# Patient Record
Sex: Female | Born: 1985 | Race: Black or African American | Hispanic: No | Marital: Single | Smoking: Never smoker
Health system: Southern US, Community
[De-identification: ages and names within clinical notes are randomized; demographics above are authoritative.]

## PROBLEM LIST (undated history)

## (undated) ENCOUNTER — Inpatient Hospital Stay (HOSPITAL_COMMUNITY): Payer: Self-pay

## (undated) DIAGNOSIS — N83299 Other ovarian cyst, unspecified side: Secondary | ICD-10-CM

## (undated) DIAGNOSIS — B999 Unspecified infectious disease: Secondary | ICD-10-CM

## (undated) DIAGNOSIS — D219 Benign neoplasm of connective and other soft tissue, unspecified: Secondary | ICD-10-CM

## (undated) DIAGNOSIS — N83209 Unspecified ovarian cyst, unspecified side: Secondary | ICD-10-CM

## (undated) DIAGNOSIS — T8859XA Other complications of anesthesia, initial encounter: Secondary | ICD-10-CM

## (undated) DIAGNOSIS — T4145XA Adverse effect of unspecified anesthetic, initial encounter: Secondary | ICD-10-CM

## (undated) DIAGNOSIS — E049 Nontoxic goiter, unspecified: Secondary | ICD-10-CM

## (undated) HISTORY — PX: KNEE SURGERY: SHX244

## (undated) HISTORY — DX: Other complications of anesthesia, initial encounter: T88.59XA

---
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
## 2008-04-30 ENCOUNTER — Emergency Department (HOSPITAL_COMMUNITY): Admission: EM | Admit: 2008-04-30 | Discharge: 2008-04-30 | Payer: Self-pay | Admitting: Emergency Medicine

## 2009-07-12 ENCOUNTER — Emergency Department (HOSPITAL_COMMUNITY): Admission: EM | Admit: 2009-07-12 | Discharge: 2009-07-12 | Payer: Self-pay | Admitting: Emergency Medicine

## 2009-10-14 ENCOUNTER — Emergency Department (HOSPITAL_COMMUNITY): Admission: EM | Admit: 2009-10-14 | Discharge: 2009-10-14 | Payer: Self-pay | Admitting: Emergency Medicine

## 2009-12-07 ENCOUNTER — Emergency Department (HOSPITAL_COMMUNITY): Admission: EM | Admit: 2009-12-07 | Discharge: 2009-12-08 | Payer: Self-pay | Admitting: Emergency Medicine

## 2011-02-19 LAB — URINALYSIS, ROUTINE W REFLEX MICROSCOPIC
Bilirubin Urine: NEGATIVE
Glucose, UA: NEGATIVE mg/dL
Ketones, ur: NEGATIVE mg/dL
Protein, ur: NEGATIVE mg/dL
Urobilinogen, UA: 1 mg/dL (ref 0.0–1.0)
pH: 5.5 (ref 5.0–8.0)

## 2011-02-19 LAB — CBC
Hemoglobin: 10.8 g/dL — ABNORMAL LOW (ref 12.0–15.0)
MCHC: 33.6 g/dL (ref 30.0–36.0)
MCV: 85.8 fL (ref 78.0–100.0)
Platelets: 232 10*3/uL (ref 150–400)
RBC: 3.73 MIL/uL — ABNORMAL LOW (ref 3.87–5.11)
WBC: 10.8 10*3/uL — ABNORMAL HIGH (ref 4.0–10.5)

## 2011-02-19 LAB — URINE MICROSCOPIC-ADD ON

## 2011-02-19 LAB — GC/CHLAMYDIA PROBE AMP, GENITAL
Chlamydia, DNA Probe: NEGATIVE
GC Probe Amp, Genital: NEGATIVE

## 2011-02-19 LAB — POCT I-STAT, CHEM 8
BUN: 5 mg/dL — ABNORMAL LOW (ref 6–23)
Calcium, Ion: 1.1 mmol/L — ABNORMAL LOW (ref 1.12–1.32)
Chloride: 105 mEq/L (ref 96–112)
Creatinine, Ser: 0.6 mg/dL (ref 0.4–1.2)
Glucose, Bld: 100 mg/dL — ABNORMAL HIGH (ref 70–99)
HCT: 33 % — ABNORMAL LOW (ref 36.0–46.0)
Hemoglobin: 11.2 g/dL — ABNORMAL LOW (ref 12.0–15.0)
Potassium: 3.4 mEq/L — ABNORMAL LOW (ref 3.5–5.1)
Sodium: 139 mEq/L (ref 135–145)
TCO2: 25 mmol/L (ref 0–100)

## 2011-02-19 LAB — WET PREP, GENITAL
WBC, Wet Prep HPF POC: NONE SEEN
Yeast Wet Prep HPF POC: NONE SEEN

## 2011-02-19 LAB — DIFFERENTIAL
Basophils Absolute: 0 10*3/uL (ref 0.0–0.1)
Eosinophils Absolute: 0.1 10*3/uL (ref 0.0–0.7)
Eosinophils Relative: 1 % (ref 0–5)

## 2011-02-19 LAB — POCT PREGNANCY, URINE: Preg Test, Ur: NEGATIVE

## 2011-03-08 ENCOUNTER — Emergency Department (HOSPITAL_COMMUNITY)
Admission: EM | Admit: 2011-03-08 | Discharge: 2011-03-08 | Disposition: A | Discharge status: Home / Self Care | Payer: Self-pay | Attending: Emergency Medicine | Admitting: Emergency Medicine

## 2011-03-08 DIAGNOSIS — H9209 Otalgia, unspecified ear: Secondary | ICD-10-CM | POA: Insufficient documentation

## 2011-03-08 DIAGNOSIS — R51 Headache: Secondary | ICD-10-CM | POA: Insufficient documentation

## 2011-03-08 DIAGNOSIS — R4182 Altered mental status, unspecified: Secondary | ICD-10-CM | POA: Insufficient documentation

## 2011-03-08 DIAGNOSIS — F1010 Alcohol abuse, uncomplicated: Secondary | ICD-10-CM | POA: Insufficient documentation

## 2011-03-08 LAB — COMPREHENSIVE METABOLIC PANEL
Albumin: 4.1 g/dL (ref 3.5–5.2)
Alkaline Phosphatase: 59 U/L (ref 39–117)
Chloride: 116 mEq/L — ABNORMAL HIGH (ref 96–112)
Creatinine, Ser: 0.57 mg/dL (ref 0.4–1.2)
Glucose, Bld: 117 mg/dL — ABNORMAL HIGH (ref 70–99)
Total Bilirubin: 0.5 mg/dL (ref 0.3–1.2)
Total Protein: 7.8 g/dL (ref 6.0–8.3)

## 2011-03-08 LAB — URINE MICROSCOPIC-ADD ON

## 2011-03-08 LAB — RAPID URINE DRUG SCREEN, HOSP PERFORMED
Barbiturates: NOT DETECTED
Benzodiazepines: NOT DETECTED
Cocaine: NOT DETECTED

## 2011-03-08 LAB — URINALYSIS, ROUTINE W REFLEX MICROSCOPIC
Bilirubin Urine: NEGATIVE
Glucose, UA: NEGATIVE mg/dL
Hgb urine dipstick: NEGATIVE
Ketones, ur: NEGATIVE mg/dL
Nitrite: NEGATIVE
Protein, ur: NEGATIVE mg/dL
Specific Gravity, Urine: 1.011 (ref 1.005–1.030)
Urobilinogen, UA: 0.2 mg/dL (ref 0.0–1.0)
Urobilinogen, UA: 2 mg/dL — ABNORMAL HIGH (ref 0.0–1.0)
pH: 6 (ref 5.0–8.0)

## 2011-03-08 LAB — URINE CULTURE: Colony Count: 25000

## 2011-03-08 LAB — ETHANOL: Alcohol, Ethyl (B): 299 mg/dL — ABNORMAL HIGH (ref 0–10)

## 2011-03-08 LAB — SALICYLATE LEVEL: Salicylate Lvl: <4 mg/dL (ref 2.8–20.0)

## 2011-03-11 LAB — COMPREHENSIVE METABOLIC PANEL
AST: 18 U/L (ref 0–37)
Albumin: 4 g/dL (ref 3.5–5.2)
Calcium: 9.3 mg/dL (ref 8.4–10.5)
Chloride: 103 mEq/L (ref 96–112)
Creatinine, Ser: 0.57 mg/dL (ref 0.4–1.2)
GFR calc Af Amer: >60 mL/min (ref >60)
Total Bilirubin: 0.8 mg/dL (ref 0.3–1.2)
Total Protein: 7.6 g/dL (ref 6.0–8.3)

## 2011-03-11 LAB — CBC
MCV: 85.1 fL (ref 78.0–100.0)
Platelets: 294 10*3/uL (ref 150–400)
RDW: 15.3 % (ref 11.5–15.5)
WBC: 10.7 10*3/uL — ABNORMAL HIGH (ref 4.0–10.5)

## 2011-03-11 LAB — URINALYSIS, ROUTINE W REFLEX MICROSCOPIC
Protein, ur: 100 mg/dL — AB
Urobilinogen, UA: 1 mg/dL (ref 0.0–1.0)

## 2011-03-11 LAB — DIFFERENTIAL
Eosinophils Relative: 1 % (ref 0–5)
Lymphocytes Relative: 11 % — ABNORMAL LOW (ref 12–46)
Lymphs Abs: 1.1 10*3/uL (ref 0.7–4.0)
Monocytes Absolute: 1.4 10*3/uL — ABNORMAL HIGH (ref 0.1–1.0)
Monocytes Relative: 13 % — ABNORMAL HIGH (ref 3–12)

## 2011-03-11 LAB — URINE MICROSCOPIC-ADD ON

## 2011-03-11 LAB — URINE CULTURE

## 2011-04-20 ENCOUNTER — Emergency Department (HOSPITAL_COMMUNITY)
Admission: EM | Admit: 2011-04-20 | Discharge: 2011-04-20 | Disposition: A | Discharge status: Other Acute Inpatient Hospital | Payer: Self-pay | Attending: Emergency Medicine | Admitting: Emergency Medicine

## 2011-04-20 ENCOUNTER — Inpatient Hospital Stay (HOSPITAL_COMMUNITY)
Admission: AD | Admit: 2011-04-20 | Discharge: 2011-04-21 | DRG: 882 | Disposition: A | Discharge status: Home / Self Care | Payer: PRIVATE HEALTH INSURANCE | Source: Ambulatory Visit | Attending: Psychiatry | Admitting: Psychiatry

## 2011-04-20 DIAGNOSIS — F1910 Other psychoactive substance abuse, uncomplicated: Secondary | ICD-10-CM

## 2011-04-20 DIAGNOSIS — T394X2A Poisoning by antirheumatics, not elsewhere classified, intentional self-harm, initial encounter: Secondary | ICD-10-CM | POA: Insufficient documentation

## 2011-04-20 DIAGNOSIS — F329 Major depressive disorder, single episode, unspecified: Secondary | ICD-10-CM

## 2011-04-20 DIAGNOSIS — T40601A Poisoning by unspecified narcotics, accidental (unintentional), initial encounter: Secondary | ICD-10-CM | POA: Insufficient documentation

## 2011-04-20 DIAGNOSIS — T50992A Poisoning by other drugs, medicaments and biological substances, intentional self-harm, initial encounter: Secondary | ICD-10-CM | POA: Insufficient documentation

## 2011-04-20 DIAGNOSIS — R45851 Suicidal ideations: Secondary | ICD-10-CM

## 2011-04-20 DIAGNOSIS — F4320 Adjustment disorder, unspecified: Principal | ICD-10-CM

## 2011-04-20 DIAGNOSIS — T398X2A Poisoning by other nonopioid analgesics and antipyretics, not elsewhere classified, intentional self-harm, initial encounter: Secondary | ICD-10-CM | POA: Insufficient documentation

## 2011-04-20 DIAGNOSIS — F3289 Other specified depressive episodes: Secondary | ICD-10-CM | POA: Insufficient documentation

## 2011-04-20 DIAGNOSIS — T450X4A Poisoning by antiallergic and antiemetic drugs, undetermined, initial encounter: Secondary | ICD-10-CM | POA: Insufficient documentation

## 2011-04-20 DIAGNOSIS — T39314A Poisoning by propionic acid derivatives, undetermined, initial encounter: Secondary | ICD-10-CM | POA: Insufficient documentation

## 2011-04-20 LAB — CBC
HCT: 34.6 % — ABNORMAL LOW (ref 36.0–46.0)
Hemoglobin: 11 g/dL — ABNORMAL LOW (ref 12.0–15.0)
MCHC: 31.8 g/dL (ref 30.0–36.0)
MCV: 80.7 fL (ref 78.0–100.0)
RDW: 15.3 % (ref 11.5–15.5)

## 2011-04-20 LAB — RAPID URINE DRUG SCREEN, HOSP PERFORMED
Barbiturates: NOT DETECTED
Opiates: POSITIVE — AB

## 2011-04-20 LAB — URINALYSIS, ROUTINE W REFLEX MICROSCOPIC
Bilirubin Urine: NEGATIVE
Nitrite: NEGATIVE
Specific Gravity, Urine: 1.027 (ref 1.005–1.030)
Urobilinogen, UA: 0.2 mg/dL (ref 0.0–1.0)
pH: 6 (ref 5.0–8.0)

## 2011-04-20 LAB — COMPREHENSIVE METABOLIC PANEL
AST: 21 U/L (ref 0–37)
BUN: 6 mg/dL (ref 6–23)
CO2: 22 mEq/L (ref 19–32)
Calcium: 8 mg/dL — ABNORMAL LOW (ref 8.4–10.5)
Chloride: 105 mEq/L (ref 96–112)
Creatinine, Ser: 0.52 mg/dL (ref 0.4–1.2)
GFR calc Af Amer: >60 mL/min (ref >60)
GFR calc non Af Amer: >60 mL/min (ref >60)
Total Bilirubin: 0.2 mg/dL — ABNORMAL LOW (ref 0.3–1.2)

## 2011-04-20 LAB — DIFFERENTIAL
Eosinophils Relative: 1 % (ref 0–5)
Lymphocytes Relative: 19 % (ref 12–46)
Lymphs Abs: 1.3 10*3/uL (ref 0.7–4.0)
Monocytes Absolute: 1.1 10*3/uL — ABNORMAL HIGH (ref 0.1–1.0)
Neutro Abs: 4.3 10*3/uL (ref 1.7–7.7)

## 2011-04-20 LAB — URINE MICROSCOPIC-ADD ON

## 2011-04-20 LAB — ETHANOL: Alcohol, Ethyl (B): 141 mg/dL — ABNORMAL HIGH (ref 0–10)

## 2011-04-20 LAB — SALICYLATE LEVEL: Salicylate Lvl: <2 mg/dL — ABNORMAL LOW (ref 2.8–20.0)

## 2011-04-20 LAB — POCT PREGNANCY, URINE: Preg Test, Ur: NEGATIVE

## 2011-04-21 DIAGNOSIS — F329 Major depressive disorder, single episode, unspecified: Secondary | ICD-10-CM

## 2011-06-05 NOTE — H&P (Signed)
  NAME:  CORNELL, GABER NO.:  [id number]  MEDICAL RECORD NO.:  [id number]  LOCATION:                                FACILITY:  [facility]  PHYSICIAN:  Landry Corporal, N.P.    DATE OF BIRTH:  05/16/1982  DATE OF ADMISSION:  04/20/2011 DATE OF DISCHARGE:                      PSYCHIATRIC ADMISSION ASSESSMENT   HISTORY OF PRESENT ILLNESS:This was done by reviewing the record. The patient was admitted after overdosing while intoxicated on alcohol.  Her urine drug screen was positive for opiates.  The patient was having some depression and having some stress at home.  MEDICATIONS:  The patient was on no medications.  ALLERGIES:  She had no allergies.  IMPRESSION AND PLAN:  Plan was to contact family.  The patient was insisting on being discharged.  She was providing vague responses.  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     Landry Corporal, N.P.     JO/MEDQ  D:  06/01/2011  T:  06/02/2011  Job:  [id number]  Electronically Signed by Limmie PatriciaP. on 06/02/2011 02:31:52 PM Electronically Signed by Nelly Rout MD on 06/05/2011 09:37:12 AM

## 2011-08-30 LAB — WET PREP, GENITAL: Yeast Wet Prep HPF POC: NONE SEEN

## 2011-08-30 LAB — CBC
HCT: 35.2 — ABNORMAL LOW
Hemoglobin: 11.9 — ABNORMAL LOW
MCHC: 33.7
MCV: 86
RBC: 4.09
WBC: 8.2

## 2011-08-30 LAB — URINALYSIS, ROUTINE W REFLEX MICROSCOPIC
Bilirubin Urine: NEGATIVE
Glucose, UA: NEGATIVE
Hgb urine dipstick: NEGATIVE
Ketones, ur: NEGATIVE
Protein, ur: NEGATIVE
pH: 7.5

## 2011-08-30 LAB — POCT I-STAT, CHEM 8
BUN: 6
Calcium, Ion: 1.19
Chloride: 104
Creatinine, Ser: 0.7
Glucose, Bld: 109 — ABNORMAL HIGH
HCT: 38
Hemoglobin: 12.9
Potassium: 3.2 — ABNORMAL LOW
Sodium: 141
TCO2: 27

## 2011-08-30 LAB — GC/CHLAMYDIA PROBE AMP, GENITAL
Chlamydia, DNA Probe: NEGATIVE
GC Probe Amp, Genital: NEGATIVE

## 2011-08-30 LAB — DIFFERENTIAL
Basophils Relative: 0
Eosinophils Absolute: 0.1
Lymphs Abs: 1.3
Monocytes Absolute: 0.8
Monocytes Relative: 10
Neutrophils Relative %: 73

## 2011-08-30 LAB — POCT PREGNANCY, URINE: Preg Test, Ur: NEGATIVE

## 2011-08-30 LAB — RPR: RPR Ser Ql: NONREACTIVE

## 2012-02-20 ENCOUNTER — Encounter (HOSPITAL_COMMUNITY): Payer: Self-pay | Admitting: *Deleted

## 2012-02-20 ENCOUNTER — Inpatient Hospital Stay (HOSPITAL_COMMUNITY)
Admission: AD | Admit: 2012-02-20 | Discharge: 2012-02-20 | Disposition: A | Discharge status: Home / Self Care | Payer: Self-pay | Source: Ambulatory Visit | Attending: Obstetrics and Gynecology | Admitting: Obstetrics and Gynecology

## 2012-02-20 DIAGNOSIS — N946 Dysmenorrhea, unspecified: Secondary | ICD-10-CM | POA: Insufficient documentation

## 2012-02-20 HISTORY — DX: Nontoxic goiter, unspecified: E04.9

## 2012-02-20 HISTORY — DX: Unspecified ovarian cyst, unspecified side: N83.209

## 2012-02-20 LAB — URINALYSIS, ROUTINE W REFLEX MICROSCOPIC
Bilirubin Urine: NEGATIVE
Glucose, UA: NEGATIVE mg/dL
Ketones, ur: NEGATIVE mg/dL
Leukocytes, UA: NEGATIVE
Nitrite: NEGATIVE
Protein, ur: NEGATIVE mg/dL
Specific Gravity, Urine: >1.03 — ABNORMAL HIGH (ref 1.005–1.030)
Urobilinogen, UA: 0.2 mg/dL (ref 0.0–1.0)
pH: 6 (ref 5.0–8.0)

## 2012-02-20 LAB — HCG, SERUM, QUALITATIVE: Preg, Serum: NEGATIVE

## 2012-02-20 LAB — URINE MICROSCOPIC-ADD ON

## 2012-02-20 NOTE — MAU Note (Addendum)
Pt reports cramping yesterday. Having some spotting. Missed period . LMP 01/19/12. Had positive pregnancy test at home. Pt stated she was told several years ago she could not get pregnant because she has cyst on her overies and her thyriod was "messed up".

## 2012-02-20 NOTE — MAU Provider Note (Signed)
Chief Complaint:  Possible Pregnancy    First Provider Initiated Contact with Patient 02/20/12 1129      Ana Moore is  26 y.o. G0P0.  Patient's last menstrual period was 01/19/2012..    She presents complaining of Possible Pregnancy  Presents confirmation of pregnancy. Reports menstrual-like cramping yesterday, none today. Vaginal spotting. States +UPT at home yesterday. Denies abd pain, discharge, HA, visual disturbance, N/V/D, fever, or chills today.   Obstetrical/Gynecological History: OB History    Grav Para Term Preterm Abortions TAB SAB Ect Mult Living   0         0      Past Medical History: Past Medical History  Diagnosis Date  . Ovarian cyst   . Thyroid enlarged     Past Surgical History: Past Surgical History  Procedure Date  . No past surgeries     Family History: No family history on file.  Social History: History  Substance Use Topics  . Smoking status: Never Smoker   . Smokeless tobacco: Not on file  . Alcohol Use: 1.2 oz/week    2 Cans of beer per week    Allergies: No Known Allergies  No prescriptions prior to admission    Review of Systems - Negative except what has been reviewed in HPI  Physical Exam   Blood pressure 109/76, pulse 73, temperature 97.7 F (36.5 C), temperature source Oral, resp. rate 16, height 5\' 4"  (1.626 m), weight 63.504 kg (140 lb), last menstrual period 01/19/2012.  General: General appearance - alert, well appearing, and in no distress, oriented to person, place, and time and normal appearing weight Mental status - alert, oriented to person, place, and time, normal mood, behavior, speech, dress, motor activity, and thought processes, affect appropriate to mood Abdomen - soft, nontender, nondistended, no masses or organomegaly Focused Gynecological Exam: examination not indicated  Labs: Recent Results (from the past 24 hour(s))  URINALYSIS, ROUTINE W REFLEX MICROSCOPIC   Collection Time   02/20/12 11:05 AM        Component Value Range   Color, Urine YELLOW  YELLOW    APPearance CLEAR  CLEAR    Specific Gravity, Urine >1.030 (*) 1.005 - 1.030    pH 6.0  5.0 - 8.0    Glucose, UA NEGATIVE  NEGATIVE (mg/dL)   Hgb urine dipstick MODERATE (*) NEGATIVE    Bilirubin Urine NEGATIVE  NEGATIVE    Ketones, ur NEGATIVE  NEGATIVE (mg/dL)   Protein, ur NEGATIVE  NEGATIVE (mg/dL)   Urobilinogen, UA 0.2  0.0 - 1.0 (mg/dL)   Nitrite NEGATIVE  NEGATIVE    Leukocytes, UA NEGATIVE  NEGATIVE   URINE MICROSCOPIC-ADD ON   Collection Time   02/20/12 11:05 AM      Component Value Range   Squamous Epithelial / LPF FEW (*) RARE    WBC, UA 0-2  <3 (WBC/hpf)   RBC / HPF 0-2  <3 (RBC/hpf)   Bacteria, UA FEW (*) RARE    Urine-Other MUCOUS PRESENT    POCT PREGNANCY, URINE   Collection Time   02/20/12 11:20 AM      Component Value Range   Preg Test, Ur NEGATIVE  NEGATIVE   HCG, SERUM, QUALITATIVE   Collection Time   02/20/12 11:38 AM      Component Value Range   Preg, Serum NEGATIVE  NEGATIVE      Assessment: Dysmenorrhea  Plan: Discharge home NSAIDs prn cramping FU with Gyn provider of your choice for routine care  Referral list given  Tametria Aho E. 02/20/2012,1:16 PM

## 2012-02-20 NOTE — Discharge Instructions (Signed)
Dysmenorrhea Menstrual pain is caused by the muscles of the uterus tightening (contracting) during a menstrual period. The muscles of the uterus contract due to the chemicals in the uterine lining. Primary dysmenorrhea is menstrual cramps that last a couple of days when you start having menstrual periods or soon after. This often begins after a teenager starts having her period. As a woman gets older or has a baby, the cramps will usually lesson or disappear. Secondary dysmenorrhea begins later in life, lasts longer, and the pain may be stronger than primary dysmenorrhea. The pain may start before the period and last a few days after the period. This type of dysmenorrhea is usually caused by an underlying problem such as:  The tissue lining the uterus grows outside of the uterus in other areas of the body (endometriosis).   The endometrial tissue, which normally lines the uterus, is found in or grows into the muscular walls of the uterus (adenomyosis).   The pelvic blood vessels are engorged with blood just before the menstrual period (pelvic congestive syndrome).   Overgrowth of cells in the lining of the uterus or cervix (polyps of the uterus or cervix).   Falling down of the uterus (prolapse) because of loose or stretched ligaments.   Depression.   Bladder problems, infection, or inflammation.   Problems with the intestine, a tumor, or irritable bowel syndrome.   Cancer of the female organs or bladder.   A severely tipped uterus.   A very tight opening or closed cervix.   Noncancerous tumors of the uterus (fibroids).   Pelvic inflammatory disease (PID).   Pelvic scarring (adhesions) from a previous surgery.   Ovarian cyst.   An intrauterine device (IUD) used for birth control.  CAUSES  The cause of menstrual pain is often unknown. SYMPTOMS   Cramping or throbbing pain in your lower abdomen.   Sometimes, a woman may also experience headaches.   Lower back pain.    Feeling sick to your stomach (nausea) or vomiting.   Diarrhea.   Sweating or dizziness.  DIAGNOSIS  A diagnosis is based on your history, symptoms, physical examination, diagnostic tests, or procedures. Diagnostic tests or procedures may include:  Blood tests.   An ultrasound.   An examination of the lining of the uterus (dilation and curettage, D&C).   An examination inside your abdomen or pelvis with a scope (laparoscopy).   X-rays.   CT Scan.   MRI.   An examination inside the bladder with a scope (cystoscopy).   An examination inside the intestine or stomach with a scope (colonoscopy, gastroscopy).  TREATMENT  Treatment depends on the cause of the dysmenorrhea. Treatment may include:  Pain medicine prescribed by your caregiver.   Birth control pills.   Hormone replacement therapy.   Nonsteroidal anti-inflammatory drugs (NSAIDs). These may help stop the production of prostaglandins.   An IUD with progesterone hormone in it.   Acupuncture.   Surgery to remove adhesions, endometriosis, ovarian cyst, or fibroids.   Removal of the uterus (hysterectomy).   Progesterone shots to stop the menstrual period.   Cutting the nerves on the sacrum that go to the female organs (presacral neurectomy).   Electric currant to the sacral nerves (sacral nerve stimulation).   Antidepressant medicine.   Psychiatric therapy, counseling, or group therapy.   Exercise and physical therapy.   Meditation and yoga therapy.  HOME CARE INSTRUCTIONS   Only take over-the-counter or prescription medicines for pain, discomfort, or fever as directed by your   caregiver.   Place a heating pad or hot water bottle on your lower back or abdomen. Do not sleep with the heating pad.   Use aerobic exercises, walking, swimming, biking, and other exercises to help lessen the cramping.   Massage to the lower back or abdomen may help.   Stop smoking.   Avoid alcohol and caffeine.   Yoga,  meditation, or acupuncture may help.  SEEK MEDICAL CARE IF:   The pain does not get better with medicine.   You have pain with sexual intercourse.  SEEK IMMEDIATE MEDICAL CARE IF:   Your pain increases and is not controlled with medicines.   You have a fever.   You develop nausea or vomiting with your period not controlled with medicine.   You have abnormal vaginal bleeding with your period.   You pass out.  MAKE SURE YOU:   Understand these instructions.   Will watch your condition.   Will get help right away if you are not doing well or get worse.  Document Released: 11/20/2005 Document Revised: 11/09/2011 Document Reviewed: 03/08/2009 [facility] Patient Information 2012 [place], [place]. RESOURCE GUIDE  Dental Problems  Patients with Medicaid: [facility]                     [phone] [address]                                           Phone:  [phone]                                                  If unable to pay or uninsured, contact:  [facility] or [facility]. to become qualified for the adult dental clinic.  Chronic Pain Problems Contact [facility]  [phone] Patients need to be referred by their primary care doctor.  Insufficient Money for Medicine Contact [employer]:  call "211" or [facility] 864-153-4880.  No Primary Care Doctor Call [facility]  [phone] Other agencies that provide inexpensive medical care    [facility]  [phone]    [facility]  [phone]    [facility]  [phone]    [facility]  [phone]    [facility]  [phone]    [facility]  [phone]  Substance Abuse Resources [facility] Services  [phone] [facility] [phone] [facility] [place] [phone] Floydene Flock [phone] Residential & Outpatient Substance Abuse Program  [phone]  Psychological Services [facility]  [phone] [facility]  [phone] [facility]   [phone] (emergency services [phone])  Abuse/Neglect [facility] Child Abuse Hotline 316-656-3810 [facility] Child Abuse Hotline 331-289-6105 (After Hours)  Emergency Shelter [facility] 4187615071  [facility] 970-747-3010 [facility] ([phone]  MRSA Hotline #:   (903)196-9691    [facility] Resources  [facility]  [employer]                           [facility] Dept. [address] [place]  [address]         [address]  Blondell Reveal Phone:  [phone]                                  Phone:  [phone]                   Phone:  [phone]  [facility] Phone:  [phone]  [facility] Child Abuse Hotline [phone] [phone] (After Hours)

## 2012-02-22 NOTE — MAU Provider Note (Signed)
Agree with above note.  Ana Moore 02/22/2012 10:00 AM

## 2012-10-03 ENCOUNTER — Emergency Department (HOSPITAL_COMMUNITY): Payer: Self-pay

## 2012-10-03 ENCOUNTER — Emergency Department (HOSPITAL_COMMUNITY)
Admission: EM | Admit: 2012-10-03 | Discharge: 2012-10-03 | Disposition: A | Discharge status: Home / Self Care | Payer: Self-pay | Attending: Emergency Medicine | Admitting: Emergency Medicine

## 2012-10-03 DIAGNOSIS — E049 Nontoxic goiter, unspecified: Secondary | ICD-10-CM | POA: Insufficient documentation

## 2012-10-03 DIAGNOSIS — M79673 Pain in unspecified foot: Secondary | ICD-10-CM

## 2012-10-03 DIAGNOSIS — M79609 Pain in unspecified limb: Secondary | ICD-10-CM | POA: Insufficient documentation

## 2012-10-03 DIAGNOSIS — N83209 Unspecified ovarian cyst, unspecified side: Secondary | ICD-10-CM | POA: Insufficient documentation

## 2012-10-03 MED ORDER — OXYCODONE-ACETAMINOPHEN 5-325 MG PO TABS
1.0000 | ORAL_TABLET | Freq: Four times a day (QID) | ORAL | Status: DC | PRN
Start: 2012-10-03 — End: 2013-05-08

## 2012-10-03 MED ORDER — IBUPROFEN 600 MG PO TABS: 600.0000 mg | ORAL_TABLET | Freq: Four times a day (QID) | ORAL | Status: DC | PRN

## 2012-10-03 NOTE — ED Provider Notes (Signed)
History  This chart was scribed for [employer]. Rubin Payor, MD by Sofie Rower. The patient was seen in room TR07C/TR07C and the patient's care was started at 11:02PM.       CSN: [id number]  Arrival date & time 10/03/12  1049   First MD Initiated Contact with Patient 10/03/12 1102      Chief Complaint  Patient presents with  . Foot Pain    (Consider location/radiation/quality/duration/timing/severity/associated sxs/prior treatment) Patient is a 26 y.o. female presenting with lower extremity pain. The history is provided by the patient. No language interpreter was used.  Foot Pain    Ana Moore is a 26 y.o. female who presents to the Emergency Department complaining of sudden, progressively worsening, foot pain located at the left foot, radiating upwards towards the left ankle, onset five days ago (09/28/12). Modifying factors include certain movements and positions of the left foot which intensifies the foot pain. The pt has a hx of ovarian cyst and enlarged thyroid.   The pt denies wearing any new shoes recently, previous injuries to the left foot, and joint problems in the past.  The pt does not smoke, however, she does drink alcohol.      Past Medical History  Diagnosis Date  . Ovarian cyst   . Thyroid enlarged     Past Surgical History  Procedure Date  . No past surgeries     No family history on file.  History  Substance Use Topics  . Smoking status: Never Smoker   . Smokeless tobacco: Not on file  . Alcohol Use: 1.2 oz/week    2 Cans of beer per week    OB History    Grav Para Term Preterm Abortions TAB SAB Ect Mult Living   0         0      Review of Systems  All other systems reviewed and are negative.    Allergies  Review of patient's allergies indicates no known allergies.  Home Medications   Current Outpatient Rx  Name Route Sig Dispense Refill  . IBUPROFEN 200 MG PO TABS Oral Take 400 mg by mouth every 6 (six) hours as needed. For  pain.    . IBUPROFEN 600 MG PO TABS Oral Take 1 tablet (600 mg total) by mouth every 6 (six) hours as needed for pain. 15 tablet 0  . OXYCODONE-ACETAMINOPHEN 5-325 MG PO TABS Oral Take 1-2 tablets by mouth every 6 (six) hours as needed for pain. 8 tablet 0    BP 112/78  Pulse 72  Temp 98.3 F (36.8 C) (Oral)  Resp 16  SpO2 100%  LMP 10/01/2012  Physical Exam  Nursing note and vitals reviewed. Constitutional: She is oriented to person, place, and time. She appears well-developed and well-nourished.  HENT:  Head: Atraumatic.  Nose: Nose normal.  Eyes: Conjunctivae normal and EOM are normal.  Neck: Normal range of motion.  Cardiovascular: Normal rate.   Pulmonary/Chest: Effort normal.  Musculoskeletal: Normal range of motion.       Left foot: She exhibits tenderness. She exhibits no crepitus and no deformity.       Left foot :Tenderness medially over the mid foot. No crepitus, no deformity. No tenderness over the plantar fascia. No erythema.   Neurological: She is alert and oriented to person, place, and time.  Skin: Skin is warm and dry.  Psychiatric: She has a normal mood and affect. Her behavior is normal.    ED Course  Procedures (including  critical care time)    COORDINATION OF CARE:  11:15 AM- Treatment plan concerning x-ray of left foot and anti-inflammatories discussed with patient. Pt agrees with treatment.   12:08 PM- Recheck. Treatment plan concerning x-ray results and application of anti-inflammatories discussed with patient. Pt agrees with treatment.        Labs Reviewed - No data to display Dg Foot Complete Left  10/03/2012  *RADIOLOGY REPORT*  Clinical Data: Mid foot pain at first metatarsal  LEFT FOOT - COMPLETE 3+ VIEW  Comparison: None  Findings: No fracture or dislocation.  IMPRESSION: Negative   Original Report Authenticated By: Esperanza Heir, M.D.       1. Foot pain       MDM  Patient with pain in her left foot. Negative x-ray. No clear  trauma or overuse. Patient be discharged home with anti-inflammatories, pain medicines and also followup as needed. She'll also be given a postop shoe     I personally performed the services described in this documentation, which was scribed in my presence. The recorded information has been reviewed and considered.     Juliet Rude. Rubin Payor, MD 10/03/12 1216

## 2012-10-03 NOTE — ED Notes (Signed)
Pt co inner foot pain without injury aince Sat night

## 2012-10-03 NOTE — ED Notes (Signed)
Patient transported to X-ray 

## 2012-10-03 NOTE — Progress Notes (Signed)
Orthopedic Tech Progress Note Patient Details:  Ana Moore [birth year] [id number] Post op shoe applied to Left foot. Tolerated well.  Ortho Devices Type of Ortho Device: Postop boot Ortho Device/Splint Location: Left  Ortho Device/Splint Interventions: Application   Asia R Thompson 10/03/2012, 12:37 PM

## 2012-10-03 NOTE — ED Notes (Signed)
Ortho paged. 

## 2013-05-08 ENCOUNTER — Emergency Department (HOSPITAL_COMMUNITY): Payer: Self-pay

## 2013-05-08 ENCOUNTER — Emergency Department (HOSPITAL_COMMUNITY)
Admission: EM | Admit: 2013-05-08 | Discharge: 2013-05-08 | Disposition: A | Discharge status: Home / Self Care | Payer: Self-pay | Attending: Emergency Medicine | Admitting: Emergency Medicine

## 2013-05-08 ENCOUNTER — Encounter (HOSPITAL_COMMUNITY): Payer: Self-pay | Admitting: Emergency Medicine

## 2013-05-08 DIAGNOSIS — N83209 Unspecified ovarian cyst, unspecified side: Secondary | ICD-10-CM | POA: Insufficient documentation

## 2013-05-08 DIAGNOSIS — Z862 Personal history of diseases of the blood and blood-forming organs and certain disorders involving the immune mechanism: Secondary | ICD-10-CM | POA: Insufficient documentation

## 2013-05-08 DIAGNOSIS — Z3202 Encounter for pregnancy test, result negative: Secondary | ICD-10-CM | POA: Insufficient documentation

## 2013-05-08 DIAGNOSIS — Z8639 Personal history of other endocrine, nutritional and metabolic disease: Secondary | ICD-10-CM | POA: Insufficient documentation

## 2013-05-08 DIAGNOSIS — R1110 Vomiting, unspecified: Secondary | ICD-10-CM | POA: Insufficient documentation

## 2013-05-08 LAB — URINALYSIS, ROUTINE W REFLEX MICROSCOPIC
Bilirubin Urine: NEGATIVE
Ketones, ur: NEGATIVE mg/dL
Nitrite: NEGATIVE
Specific Gravity, Urine: 1.018 (ref 1.005–1.030)
pH: 7 (ref 5.0–8.0)

## 2013-05-08 LAB — URINE MICROSCOPIC-ADD ON

## 2013-05-08 LAB — WET PREP, GENITAL
Trich, Wet Prep: NONE SEEN
Yeast Wet Prep HPF POC: NONE SEEN

## 2013-05-08 LAB — GC/CHLAMYDIA PROBE AMP
CT Probe RNA: NEGATIVE
GC Probe RNA: NEGATIVE

## 2013-05-08 MED ORDER — HYDROCODONE-ACETAMINOPHEN 5-325 MG PO TABS: 2.0000 | ORAL_TABLET | Freq: Four times a day (QID) | ORAL | Status: DC | PRN

## 2013-05-08 MED ORDER — OXYCODONE-ACETAMINOPHEN 5-325 MG PO TABS
2.0000 | ORAL_TABLET | Freq: Once | ORAL | Status: AC
  Administered 2013-05-08: 2 via ORAL
  Filled 2013-05-08: qty 2

## 2013-05-08 MED ORDER — KETOROLAC TROMETHAMINE 60 MG/2ML IM SOLN
60.0000 mg | Freq: Once | INTRAMUSCULAR | Status: DC
  Filled 2013-05-08: qty 2

## 2013-05-08 NOTE — ED Notes (Signed)
Pt brought to ED via [facility] for evaluation of abdominal pain.  After engaging in sexual intercourse this evening pt began having severe mid abdominal pain- hx of ovarian cysts.  Pt LMP was 3 days ago.

## 2013-05-08 NOTE — ED Provider Notes (Signed)
History     CSN: [id number]  Arrival date & time 05/08/13  0107   First MD Initiated Contact with Patient 05/08/13 0123      Chief Complaint  Patient presents with  . Abdominal Pain    (Consider location/radiation/quality/duration/timing/severity/associated sxs/prior treatment) The history is provided by the patient.  Ana Moore is a 27 y.o. female hx of ovarian cyst, possible fibroids here with lower abdominal pain. She was having vaginal intercourse with her female partner at 10:30 PM last night. Afterwards she had acute onset of lower abdominal pain. It is across her entire lower abdomen. Denies vaginal discharge or urinary symptoms. She had some vomiting as well. She had remote history of ovarian cysts and thought she may have fibroids but was never formally diagnosed. LMP 3 days ago.    Past Medical History  Diagnosis Date  . Ovarian cyst   . Thyroid enlarged     Past Surgical History  Procedure Laterality Date  . No past surgeries      No family history on file.  History  Substance Use Topics  . Smoking status: Never Smoker   . Smokeless tobacco: Not on file  . Alcohol Use: 1.2 oz/week    2 Cans of beer per week    OB History   Grav Para Term Preterm Abortions TAB SAB Ect Mult Living   0         0      Review of Systems  Gastrointestinal: Positive for vomiting and abdominal pain.  All other systems reviewed and are negative.    Allergies  Shellfish allergy  Home Medications   Current Outpatient Rx  Name  Route  Sig  Dispense  Refill  . ibuprofen (ADVIL,MOTRIN) 200 MG tablet   Oral   Take 200-1,000 mg by mouth every 6 (six) hours as needed for pain. For pain.           BP 105/69  Pulse 75  Temp(Src) 97.9 F (36.6 C) (Oral)  SpO2 99%  LMP 05/05/2013  Physical Exam  Nursing note and vitals reviewed. Constitutional: She is oriented to person, place, and time.  Uncomfortable, holding abdomen   HENT:  Head: Normocephalic.   Mouth/Throat: Oropharynx is clear and moist.  Eyes: Conjunctivae are normal. Pupils are equal, round, and reactive to light.  Neck: Normal range of motion. Neck supple.  Cardiovascular: Normal rate, regular rhythm and normal heart sounds.   Pulmonary/Chest: Effort normal and breath sounds normal. No respiratory distress. She has no wheezes. She has no rales.  Abdominal: Soft.  + firm and tender lower abdominal mass.   Genitourinary:  No obvious labial tears. No CMT. No adnexal tenderness. + enlarged uterus that is firm and tender.   Musculoskeletal: Normal range of motion.  Neurological: She is alert and oriented to person, place, and time.  Skin: Skin is warm and dry.  Psychiatric: She has a normal mood and affect. Her behavior is normal. Judgment and thought content normal.    ED Course  Procedures (including critical care time)  Labs Reviewed  WET PREP, GENITAL - Abnormal; Notable for the following:    Clue Cells Wet Prep HPF POC FEW (*)    WBC, Wet Prep HPF POC FEW (*)    All other components within normal limits  URINALYSIS, ROUTINE W REFLEX MICROSCOPIC - Abnormal; Notable for the following:    Hgb urine dipstick TRACE (*)    All other components within normal limits  URINE MICROSCOPIC-ADD ON -  Abnormal; Notable for the following:    Squamous Epithelial / LPF FEW (*)    All other components within normal limits  GC/CHLAMYDIA PROBE AMP  PREGNANCY, URINE   US Transvaginal Non-ob  05/08/2013   *RADIOLOGY REPORT*  Clinical Data:  Lower pelvic pain.  History of hemorrhagic left ovarian cyst.  TRANSABDOMINAL AND TRANSVAGINAL ULTRASOUND OF PELVIS DOPPLER ULTRASOUND OF OVARIES  Technique:  Both transabdominal and transvaginal ultrasound examinations of the pelvis were performed. Transabdominal technique was performed for global imaging of the pelvis including uterus, ovaries, adnexal regions, and pelvic cul-de-sac.  It was necessary to proceed with endovaginal exam following the  transabdominal exam to visualize the uterus and ovaries in greater detail.  Color and duplex Doppler ultrasound was utilized to evaluate blood flow to the ovaries.  Comparison:  Pelvic ultrasound performed 12/08/2009  FINDINGS  Uterus:  Normal in size and appearance; measures 5.0 x 2.3 x 4.5 cm.  Endometrium:  Normal in size and appearance; measures 0.4 cm in thickness.  Right ovary: Normal appearance/no adnexal mass; measures 2.8 x 2.1 x 2.8 cm.  Left ovary: There is a very large relatively homogeneous hemorrhagic cyst at the left ovary, measuring 9.0 x 8.7 x 8.5 cm. Diffuse internal echoes are seen; there is no evidence of internal organization, and this appears relatively acute.  The left ovary measures 10.4 x 9.9 x 8.9 cm, as normal ovarian tissue is displaced peripherally by the large hemorrhagic cyst.  Pulsed Doppler evaluation demonstrates normal low-resistance arterial and venous waveforms in both ovaries.  A trace amount of free fluid is seen within the pelvic cul-de-sac.  IMPRESSION:  1.  Very large relatively homogeneous hemorrhagic cyst at the left ovary, measuring 9.0 x 8.7 x 8.5 cm.  No evidence of internal organization, suggesting that this is relatively acute in nature. This has increased significantly in size from 2011. 2.  No evidence of ovarian torsion; uterus unremarkable in appearance.   Original Report Authenticated By: Tonia Ghent, M.D.   US Pelvis Complete  05/08/2013   *RADIOLOGY REPORT*  Clinical Data:  Lower pelvic pain.  History of hemorrhagic left ovarian cyst.  TRANSABDOMINAL AND TRANSVAGINAL ULTRASOUND OF PELVIS DOPPLER ULTRASOUND OF OVARIES  Technique:  Both transabdominal and transvaginal ultrasound examinations of the pelvis were performed. Transabdominal technique was performed for global imaging of the pelvis including uterus, ovaries, adnexal regions, and pelvic cul-de-sac.  It was necessary to proceed with endovaginal exam following the transabdominal exam to visualize the  uterus and ovaries in greater detail.  Color and duplex Doppler ultrasound was utilized to evaluate blood flow to the ovaries.  Comparison:  Pelvic ultrasound performed 12/08/2009  FINDINGS  Uterus:  Normal in size and appearance; measures 5.0 x 2.3 x 4.5 cm.  Endometrium:  Normal in size and appearance; measures 0.4 cm in thickness.  Right ovary: Normal appearance/no adnexal mass; measures 2.8 x 2.1 x 2.8 cm.  Left ovary: There is a very large relatively homogeneous hemorrhagic cyst at the left ovary, measuring 9.0 x 8.7 x 8.5 cm. Diffuse internal echoes are seen; there is no evidence of internal organization, and this appears relatively acute.  The left ovary measures 10.4 x 9.9 x 8.9 cm, as normal ovarian tissue is displaced peripherally by the large hemorrhagic cyst.  Pulsed Doppler evaluation demonstrates normal low-resistance arterial and venous waveforms in both ovaries.  A trace amount of free fluid is seen within the pelvic cul-de-sac.  IMPRESSION:  1.  Very large relatively homogeneous hemorrhagic cyst at  the left ovary, measuring 9.0 x 8.7 x 8.5 cm.  No evidence of internal organization, suggesting that this is relatively acute in nature. This has increased significantly in size from 2011. 2.  No evidence of ovarian torsion; uterus unremarkable in appearance.   Original Report Authenticated By: Tonia Ghent, M.D.   [place] Art/ven Flow Abd Pelv Doppler  05/08/2013   *RADIOLOGY REPORT*  Clinical Data:  Lower pelvic pain.  History of hemorrhagic left ovarian cyst.  TRANSABDOMINAL AND TRANSVAGINAL ULTRASOUND OF PELVIS DOPPLER ULTRASOUND OF OVARIES  Technique:  Both transabdominal and transvaginal ultrasound examinations of the pelvis were performed. Transabdominal technique was performed for global imaging of the pelvis including uterus, ovaries, adnexal regions, and pelvic cul-de-sac.  It was necessary to proceed with endovaginal exam following the transabdominal exam to visualize the uterus and ovaries in  greater detail.  Color and duplex Doppler ultrasound was utilized to evaluate blood flow to the ovaries.  Comparison:  Pelvic ultrasound performed 12/08/2009  FINDINGS  Uterus:  Normal in size and appearance; measures 5.0 x 2.3 x 4.5 cm.  Endometrium:  Normal in size and appearance; measures 0.4 cm in thickness.  Right ovary: Normal appearance/no adnexal mass; measures 2.8 x 2.1 x 2.8 cm.  Left ovary: There is a very large relatively homogeneous hemorrhagic cyst at the left ovary, measuring 9.0 x 8.7 x 8.5 cm. Diffuse internal echoes are seen; there is no evidence of internal organization, and this appears relatively acute.  The left ovary measures 10.4 x 9.9 x 8.9 cm, as normal ovarian tissue is displaced peripherally by the large hemorrhagic cyst.  Pulsed Doppler evaluation demonstrates normal low-resistance arterial and venous waveforms in both ovaries.  A trace amount of free fluid is seen within the pelvic cul-de-sac.  IMPRESSION:  1.  Very large relatively homogeneous hemorrhagic cyst at the left ovary, measuring 9.0 x 8.7 x 8.5 cm.  No evidence of internal organization, suggesting that this is relatively acute in nature. This has increased significantly in size from 2011. 2.  No evidence of ovarian torsion; uterus unremarkable in appearance.   Original Report Authenticated By: Tonia Ghent, M.D.     No diagnosis found.    MDM  Ana Moore is a 27 y.o. female here with lower abdominal pain and mass like structure. Likely fibroid vs pregnancy. Will do pelvis exam and will likely need [place].   4:48 AM Pelvis exam showed possibly large uterus. US showed large hemorrhagic cyst L ovary with no torsion. I called Dr. Despina Hidden from [facility]. He recommends outpatient f/u to schedule for surgery and lortab for pain.        Richardean Canal, MD 05/08/13 [phone]

## 2013-06-18 ENCOUNTER — Encounter (HOSPITAL_COMMUNITY): Payer: Self-pay | Admitting: Emergency Medicine

## 2013-06-18 ENCOUNTER — Emergency Department (HOSPITAL_COMMUNITY)
Admission: EM | Admit: 2013-06-18 | Discharge: 2013-06-19 | Disposition: A | Discharge status: Home / Self Care | Payer: Self-pay | Attending: Emergency Medicine | Admitting: Emergency Medicine

## 2013-06-18 DIAGNOSIS — Z862 Personal history of diseases of the blood and blood-forming organs and certain disorders involving the immune mechanism: Secondary | ICD-10-CM | POA: Insufficient documentation

## 2013-06-18 DIAGNOSIS — N12 Tubulo-interstitial nephritis, not specified as acute or chronic: Secondary | ICD-10-CM | POA: Insufficient documentation

## 2013-06-18 DIAGNOSIS — R000 Tachycardia, unspecified: Secondary | ICD-10-CM | POA: Insufficient documentation

## 2013-06-18 DIAGNOSIS — R109 Unspecified abdominal pain: Secondary | ICD-10-CM | POA: Insufficient documentation

## 2013-06-18 DIAGNOSIS — Z8639 Personal history of other endocrine, nutritional and metabolic disease: Secondary | ICD-10-CM | POA: Insufficient documentation

## 2013-06-18 DIAGNOSIS — R509 Fever, unspecified: Secondary | ICD-10-CM | POA: Insufficient documentation

## 2013-06-18 DIAGNOSIS — N83209 Unspecified ovarian cyst, unspecified side: Secondary | ICD-10-CM | POA: Insufficient documentation

## 2013-06-18 MED ORDER — IBUPROFEN 800 MG PO TABS: 800.0000 mg | ORAL_TABLET | Freq: Once | ORAL | Status: DC

## 2013-06-18 MED ORDER — ACETAMINOPHEN 325 MG PO TABS: 650.0000 mg | ORAL_TABLET | Freq: Once | ORAL | Status: DC

## 2013-06-18 NOTE — ED Notes (Signed)
PT. REPORTS GENERALIZED BODY ACHES AND PAIN WITH OCCASIONAL DRY COUGH AND  LOW GRADE FEVER  ONSET YESTERDAY , VOMITTED X1 YESTERDAY .

## 2013-06-19 ENCOUNTER — Emergency Department (HOSPITAL_COMMUNITY): Payer: Self-pay

## 2013-06-19 ENCOUNTER — Encounter (HOSPITAL_COMMUNITY): Payer: Self-pay | Admitting: Radiology

## 2013-06-19 LAB — URINALYSIS, ROUTINE W REFLEX MICROSCOPIC
Bilirubin Urine: NEGATIVE
Nitrite: NEGATIVE
Protein, ur: 30 mg/dL — AB
Specific Gravity, Urine: 1.016 (ref 1.005–1.030)
Urobilinogen, UA: 2 mg/dL — ABNORMAL HIGH (ref 0.0–1.0)

## 2013-06-19 LAB — COMPREHENSIVE METABOLIC PANEL
AST: 12 U/L (ref 0–37)
Albumin: 3.5 g/dL (ref 3.5–5.2)
BUN: 8 mg/dL (ref 6–23)
Calcium: 9.4 mg/dL (ref 8.4–10.5)
Creatinine, Ser: 0.68 mg/dL (ref 0.50–1.10)
Total Protein: 8.2 g/dL (ref 6.0–8.3)

## 2013-06-19 LAB — CBC WITH DIFFERENTIAL/PLATELET
Basophils Absolute: 0 10*3/uL (ref 0.0–0.1)
Basophils Relative: 0 % (ref 0–1)
Eosinophils Absolute: 0 10*3/uL (ref 0.0–0.7)
Eosinophils Relative: 0 % (ref 0–5)
HCT: 37.4 % (ref 36.0–46.0)
MCH: 28.2 pg (ref 26.0–34.0)
MCHC: 34 g/dL (ref 30.0–36.0)
MCV: 83.1 fL (ref 78.0–100.0)
Monocytes Absolute: 2.5 10*3/uL — ABNORMAL HIGH (ref 0.1–1.0)
Neutro Abs: 11.3 10*3/uL — ABNORMAL HIGH (ref 1.7–7.7)
RDW: 14.3 % (ref 11.5–15.5)

## 2013-06-19 LAB — POCT I-STAT, CHEM 8
BUN: 7 mg/dL (ref 6–23)
Creatinine, Ser: 0.8 mg/dL (ref 0.50–1.10)
Potassium: 3.3 mEq/L — ABNORMAL LOW (ref 3.5–5.1)
Sodium: 136 mEq/L (ref 135–145)

## 2013-06-19 LAB — URINE MICROSCOPIC-ADD ON

## 2013-06-19 LAB — LIPASE, BLOOD: Lipase: 13 U/L (ref 11–59)

## 2013-06-19 MED ORDER — ONDANSETRON HCL 4 MG/2ML IJ SOLN
4.0000 mg | Freq: Once | INTRAMUSCULAR | Status: AC
  Administered 2013-06-19: 4 mg via INTRAVENOUS
  Filled 2013-06-19: qty 2

## 2013-06-19 MED ORDER — CEFTRIAXONE SODIUM 1 G IJ SOLR: 1.0000 g | Freq: Once | INTRAMUSCULAR | Status: DC

## 2013-06-19 MED ORDER — TRAMADOL HCL 50 MG PO TABS: 50.0000 mg | ORAL_TABLET | Freq: Four times a day (QID) | ORAL | Status: DC | PRN

## 2013-06-19 MED ORDER — SODIUM CHLORIDE 0.9 % IV BOLUS (SEPSIS)
1000.0000 mL | Freq: Once | INTRAVENOUS | Status: AC
  Administered 2013-06-19: 1000 mL via INTRAVENOUS

## 2013-06-19 MED ORDER — DEXTROSE 5 % IV SOLN
1.0000 g | Freq: Once | INTRAVENOUS | Status: AC
  Administered 2013-06-19: 1 g via INTRAVENOUS
  Filled 2013-06-19: qty 10

## 2013-06-19 MED ORDER — PROMETHAZINE HCL 25 MG PO TABS: 25.0000 mg | ORAL_TABLET | Freq: Four times a day (QID) | ORAL | Status: DC | PRN

## 2013-06-19 MED ORDER — CIPROFLOXACIN HCL 500 MG PO TABS: 500.0000 mg | ORAL_TABLET | Freq: Two times a day (BID) | ORAL | Status: DC

## 2013-06-19 MED ORDER — HYDROMORPHONE HCL PF 1 MG/ML IJ SOLN
1.0000 mg | Freq: Once | INTRAMUSCULAR | Status: AC
  Administered 2013-06-19: 1 mg via INTRAVENOUS
  Filled 2013-06-19: qty 1

## 2013-06-19 NOTE — ED Notes (Signed)
Pt refusing ibuprofen and tylenol "i have been taking it at home and it doesn't work".

## 2013-06-19 NOTE — ED Provider Notes (Signed)
History    CSN: [id number] Arrival date & time 06/18/13  2247  First MD Initiated Contact with Patient 06/19/13 0122     Chief Complaint  Patient presents with  . Generalized Body Aches   (Consider location/radiation/quality/duration/timing/severity/associated sxs/prior Treatment) HPI History provided by patient. Fever and bodyaches since yesterday. She has had left flank pain for about the last week that is sharp and worse with coughing. No productive cough. No hemoptysis. No shortness of breath. She denies any abdominal pain otherwise. No dysuria, urgency or frequency. She denies any vaginal bleeding or discharge. No rash. No trauma. Symptoms moderate in severity. Vomit once yesterday now denies any nausea or diarrhea. No blood in stools.  Past Medical History  Diagnosis Date  . Ovarian cyst   . Thyroid enlarged    Past Surgical History  Procedure Laterality Date  . No past surgeries     No family history on file. History  Substance Use Topics  . Smoking status: Never Smoker   . Smokeless tobacco: Not on file  . Alcohol Use: 1.2 oz/week    2 Cans of beer per week   OB History   Grav Para Term Preterm Abortions TAB SAB Ect Mult Living   0         0     Review of Systems  Constitutional: Positive for fever and chills.  HENT: Negative for neck pain and neck stiffness.   Eyes: Negative for visual disturbance.  Respiratory: Negative for shortness of breath.   Cardiovascular: Negative for chest pain.  Gastrointestinal: Negative for abdominal pain.  Genitourinary: Positive for flank pain. Negative for dysuria, hematuria and difficulty urinating.  Musculoskeletal: Negative for back pain.  Skin: Negative for rash.  Neurological: Negative for headaches.  All other systems reviewed and are negative.    Allergies  Shellfish allergy  Home Medications   Current Outpatient Rx  Name  Route  Sig  Dispense  Refill  . acetaminophen (TYLENOL) 500 MG tablet   Oral   Take  1,000 mg by mouth every 6 (six) hours as needed for pain.         Marland Kitchen ibuprofen (ADVIL,MOTRIN) 200 MG tablet   Oral   Take 600 mg by mouth every 6 (six) hours as needed for pain. For pain.         . naproxen sodium (ANAPROX) 220 MG tablet   Oral   Take 440 mg by mouth 2 (two) times daily as needed (for pain).          BP 107/70  Pulse 110  Temp(Src) 101.6 F (38.7 C) (Oral)  Resp 18  SpO2 100%  LMP 06/03/2013 Physical Exam  Constitutional: She is oriented to person, place, and time. She appears well-developed and well-nourished.  HENT:  Head: Normocephalic and atraumatic.  Mouth/Throat: Oropharynx is clear and moist. No oropharyngeal exudate.  Eyes: Conjunctivae and EOM are normal. Pupils are equal, round, and reactive to light. No scleral icterus.  Neck: Neck supple.  Cardiovascular: Regular rhythm and intact distal pulses.   Mild tachycardia heart rate 100-110  Pulmonary/Chest: Effort normal and breath sounds normal. No respiratory distress. She exhibits no tenderness.  Abdominal: Soft. Bowel sounds are normal. She exhibits no distension and no mass. There is no tenderness. There is no rebound and no guarding.  No CVA tenderness. Mild left flank tenderness.  Musculoskeletal: Normal range of motion. She exhibits no edema and no tenderness.  Neurological: She is alert and oriented to person, place, and  time.  Skin: Skin is warm and dry.    ED Course  Procedures (including critical care time)  Results for orders placed during the hospital encounter of 06/18/13  URINALYSIS, ROUTINE W REFLEX MICROSCOPIC      Result Value Range   Color, Urine YELLOW  YELLOW   APPearance CLOUDY (*) CLEAR   Specific Gravity, Urine 1.016  1.005 - 1.030   pH 6.0  5.0 - 8.0   Glucose, UA NEGATIVE  NEGATIVE mg/dL   Hgb urine dipstick LARGE (*) NEGATIVE   Bilirubin Urine NEGATIVE  NEGATIVE   Ketones, ur >80 (*) NEGATIVE mg/dL   Protein, ur 30 (*) NEGATIVE mg/dL   Urobilinogen, UA 2.0 (*)  0.0 - 1.0 mg/dL   Nitrite NEGATIVE  NEGATIVE   Leukocytes, UA MODERATE (*) NEGATIVE  PREGNANCY, URINE      Result Value Range   Preg Test, Ur NEGATIVE  NEGATIVE  URINE MICROSCOPIC-ADD ON      Result Value Range   Squamous Epithelial / LPF MANY (*) RARE   WBC, UA 11-20  <3 WBC/hpf   RBC / HPF 11-20  <3 RBC/hpf   Bacteria, UA MANY (*) RARE   Urine-Other MUCOUS PRESENT    CBC WITH DIFFERENTIAL      Result Value Range   WBC 15.4 (*) 4.0 - 10.5 K/uL   RBC 4.50  3.87 - 5.11 MIL/uL   Hemoglobin 12.7  12.0 - 15.0 g/dL   HCT 16.1  09.6 - 04.5 %   MCV 83.1  78.0 - 100.0 fL   MCH 28.2  26.0 - 34.0 pg   MCHC 34.0  30.0 - 36.0 g/dL   RDW 40.9  81.1 - 91.4 %   Platelets 272  150 - 400 K/uL   Neutrophils Relative % 73  43 - 77 %   Neutro Abs 11.3 (*) 1.7 - 7.7 K/uL   Lymphocytes Relative 10 (*) 12 - 46 %   Lymphs Abs 1.6  0.7 - 4.0 K/uL   Monocytes Relative 16 (*) 3 - 12 %   Monocytes Absolute 2.5 (*) 0.1 - 1.0 K/uL   Eosinophils Relative 0  0 - 5 %   Eosinophils Absolute 0.0  0.0 - 0.7 K/uL   Basophils Relative 0  0 - 1 %   Basophils Absolute 0.0  0.0 - 0.1 K/uL  COMPREHENSIVE METABOLIC PANEL      Result Value Range   Sodium 132 (*) 135 - 145 mEq/L   Potassium 3.1 (*) 3.5 - 5.1 mEq/L   Chloride 95 (*) 96 - 112 mEq/L   CO2 26  19 - 32 mEq/L   Glucose, Bld 82  70 - 99 mg/dL   BUN 8  6 - 23 mg/dL   Creatinine, Ser 7.82  0.50 - 1.10 mg/dL   Calcium 9.4  8.4 - 95.6 mg/dL   Total Protein 8.2  6.0 - 8.3 g/dL   Albumin 3.5  3.5 - 5.2 g/dL   AST 12  0 - 37 U/L   ALT 10  0 - 35 U/L   Alkaline Phosphatase 96  39 - 117 U/L   Total Bilirubin 0.6  0.3 - 1.2 mg/dL   GFR calc non Af Amer >90  >90 mL/min   GFR calc Af Amer >90  >90 mL/min  LIPASE, BLOOD      Result Value Range   Lipase 13  11 - 59 U/L  POCT I-STAT, CHEM 8      Result Value Range  Sodium 136  135 - 145 mEq/L   Potassium 3.3 (*) 3.5 - 5.1 mEq/L   Chloride 99  96 - 112 mEq/L   BUN 7  6 - 23 mg/dL   Creatinine, Ser 6.96   0.50 - 1.10 mg/dL   Glucose, Bld 85  70 - 99 mg/dL   Calcium, Ion 2.95 (*) 1.12 - 1.23 mmol/L   TCO2 25  0 - 100 mmol/L   Hemoglobin 13.6  12.0 - 15.0 g/dL   HCT 28.4  13.2 - 44.0 %   Ct Abdomen Pelvis Wo Contrast  06/19/2013   *RADIOLOGY REPORT*  Clinical Data: Left flank pain and fever.  CT ABDOMEN AND PELVIS WITHOUT CONTRAST  Technique:  Multidetector CT imaging of the abdomen and pelvis was performed following the standard protocol without intravenous contrast.  Comparison: CT of the abdomen and pelvis obtained 07/12/2009, and pelvic ultrasound performed 05/08/2013  Findings: The visualized lung bases are clear.  The liver and spleen are unremarkable in appearance.  The gallbladder is within normal limits.  The pancreas and adrenal glands are unremarkable.  There is mild abnormal enlargement of the left kidney, with mild perinephric stranding, raising concern for left-sided pyelonephritis.  The right kidney is unremarkable in appearance. No hydronephrosis is seen.  No renal or ureteral stones are identified.  No free fluid is identified.  The small bowel is unremarkable in appearance.  The stomach is within normal limits.  No acute vascular abnormalities are seen.  The appendix is normal in caliber, without evidence for appendicitis.  The colon is unremarkable in appearance.  The bladder is mildly distended and grossly unremarkable in appearance.  The uterus is grossly unremarkable.  A very large hemorrhagic cyst is again noted arising at the left ovary, measuring 9.6 x 9.3 x 7.0 cm. The right ovary is grossly unremarkable appearance.  No new suspicious adnexal masses are seen.  No inguinal lymphadenopathy is seen.  No acute osseous abnormalities are identified.  IMPRESSION:  1.  Mild abnormal enlargement of the left kidney, with mild new perinephric stranding, concerning for left-sided pyelonephritis. 2.  Very large hemorrhagic cyst again noted arising at the left ovary, measuring 9.6 x 9.3 x 7.0 cm; it  is relatively stable from the prior ultrasound.   Original Report Authenticated By: Tonia Ghent, M.D.   Dg Chest 2 View  06/19/2013   *RADIOLOGY REPORT*  Clinical Data: Bodyaches and cough for 2 days.  CHEST - 2 VIEW  Comparison: None.  Findings: No significant osseous abnormality.  Lungs are clear. No effusion or pneumothorax.  Cardiomediastinal size and contour are within normal limits.  The upper abdomen is unremarkable.  IMPRESSION: No evidence of acute cardiopulmonary disease.   Original Report Authenticated By: Tiburcio Pea   IV fluids. IV Dilaudid. IV antibiotics.  3:33 AM no emesis. The patient feeling better other than mild headache. CT results as above shared with patient. She was evaluated by OB/GYN and recommended surgery for left ovarian cyst. She has not followed up since that time. After brief discussion she agrees to close followup in the clinic for recheck, further evaluation and followup of her urine culture results. She agrees to strict return precautions. Prescription for antibiotics, antiemetics and pain medications provided. She is stable for discharge home  MDM  Fever, bodyaches and left flank pain - treated for pyelonephritis. Has a known left hemorrhagic ovarian cyst  Evaluated with imaging, labs and urinalysis all reviewed as above.  Symptomatically improved with IV fluids and IV  narcotics  Vital signs and nursing notes reviewed and considered  Sunnie Nielsen, MD 06/19/13 [phone]

## 2013-06-21 LAB — URINE CULTURE: Colony Count: >=100000

## 2013-06-22 ENCOUNTER — Telehealth (HOSPITAL_COMMUNITY): Payer: Self-pay | Admitting: Emergency Medicine

## 2013-06-22 NOTE — ED Notes (Signed)
Post ED Visit - Positive Culture Follow-up  Culture report reviewed by antimicrobial stewardship pharmacist: []  Wes Dulaney, Pharm.D., BCPS []  Celedonio Miyamoto, [address].D., BCPS [x]  Georgina Pillion, Pharm.D., BCPS []  [place], [place].D., BCPS, AAHIVP []  Estella Husk, Pharm.D., BCPS, AAHIVP  Positive urine culture Treated with Cipro, organism sensitive to the same and no further patient follow-up is required at this time.  Ana Moore 06/22/2013, 11:26 AM

## 2014-09-12 ENCOUNTER — Emergency Department (HOSPITAL_COMMUNITY)
Admission: EM | Admit: 2014-09-12 | Discharge: 2014-09-13 | Disposition: A | Discharge status: Home / Self Care | Payer: PRIVATE HEALTH INSURANCE | Attending: Emergency Medicine | Admitting: Emergency Medicine

## 2014-09-12 ENCOUNTER — Encounter (HOSPITAL_COMMUNITY): Payer: Self-pay | Admitting: Emergency Medicine

## 2014-09-12 DIAGNOSIS — Z8639 Personal history of other endocrine, nutritional and metabolic disease: Secondary | ICD-10-CM | POA: Insufficient documentation

## 2014-09-12 DIAGNOSIS — Z3202 Encounter for pregnancy test, result negative: Secondary | ICD-10-CM | POA: Insufficient documentation

## 2014-09-12 DIAGNOSIS — Z792 Long term (current) use of antibiotics: Secondary | ICD-10-CM | POA: Insufficient documentation

## 2014-09-12 DIAGNOSIS — R109 Unspecified abdominal pain: Secondary | ICD-10-CM

## 2014-09-12 DIAGNOSIS — N898 Other specified noninflammatory disorders of vagina: Secondary | ICD-10-CM | POA: Insufficient documentation

## 2014-09-12 DIAGNOSIS — Z79899 Other long term (current) drug therapy: Secondary | ICD-10-CM | POA: Insufficient documentation

## 2014-09-12 LAB — CBC WITH DIFFERENTIAL/PLATELET
Basophils Absolute: 0 10*3/uL (ref 0.0–0.1)
Basophils Relative: 0 % (ref 0–1)
EOS ABS: 0.1 10*3/uL (ref 0.0–0.7)
EOS PCT: 1 % (ref 0–5)
HCT: 35.8 % — ABNORMAL LOW (ref 36.0–46.0)
HEMOGLOBIN: 11.8 g/dL — AB (ref 12.0–15.0)
LYMPHS ABS: 1.6 10*3/uL (ref 0.7–4.0)
Lymphocytes Relative: 19 % (ref 12–46)
MCH: 28 pg (ref 26.0–34.0)
MCHC: 33 g/dL (ref 30.0–36.0)
MCV: 85 fL (ref 78.0–100.0)
MONOS PCT: 16 % — AB (ref 3–12)
Monocytes Absolute: 1.3 10*3/uL — ABNORMAL HIGH (ref 0.1–1.0)
Neutro Abs: 5.4 10*3/uL (ref 1.7–7.7)
Neutrophils Relative %: 64 % (ref 43–77)
PLATELETS: 297 10*3/uL (ref 150–400)
RBC: 4.21 MIL/uL (ref 3.87–5.11)
RDW: 13.9 % (ref 11.5–15.5)
WBC: 8.4 10*3/uL (ref 4.0–10.5)

## 2014-09-12 LAB — COMPREHENSIVE METABOLIC PANEL
ALT: 10 U/L (ref 0–35)
ANION GAP: 10 (ref 5–15)
AST: 15 U/L (ref 0–37)
Albumin: 3.7 g/dL (ref 3.5–5.2)
Alkaline Phosphatase: 73 U/L (ref 39–117)
BUN: 8 mg/dL (ref 6–23)
CALCIUM: 8.7 mg/dL (ref 8.4–10.5)
CO2: 25 mEq/L (ref 19–32)
CREATININE: 0.54 mg/dL (ref 0.50–1.10)
Chloride: 102 mEq/L (ref 96–112)
GFR calc non Af Amer: >90 mL/min (ref >90)
GLUCOSE: 97 mg/dL (ref 70–99)
Potassium: 3.6 mEq/L — ABNORMAL LOW (ref 3.7–5.3)
Sodium: 137 mEq/L (ref 137–147)
TOTAL PROTEIN: 7.8 g/dL (ref 6.0–8.3)
Total Bilirubin: <0.2 mg/dL — ABNORMAL LOW (ref 0.3–1.2)

## 2014-09-12 LAB — LIPASE, BLOOD: Lipase: 18 U/L (ref 11–59)

## 2014-09-12 NOTE — ED Notes (Signed)
Pt. reports right flank pain worse when bending onset yesterday , denies dysuria or hematuria , no fever or chills.

## 2014-09-12 NOTE — ED Provider Notes (Signed)
CSN: [id number]     Arrival date & time 09/12/14  2127 History  This chart was scribed for Jeannett Senior, PA-C working with Johnna Acosta, MD by Randa Evens, ED Scribe. This patient was seen in room TR07C/TR07C and the patient's care was started at 10:54 PM.     Chief Complaint  Patient presents with  . Flank Pain   Patient is a 28 y.o. female presenting with flank pain. The history is provided by the patient. No language interpreter was used.  Flank Pain Associated symptoms include abdominal pain.   HPI Comments: Ana Moore is a 28 y.o. female who presents to the Emergency Department complaining of right flank pain onset 1 day prior. She states she has some associated abdominal pain as well. She states that she think that she may have a kidney infection. She states that bending worsens her symptoms. She states that she does have vaginal discharge. She states that this may be due to her having an ovarian cyst.  Denies dysuria, urinary frequency, urinary urgency, fever, chills  Past Medical History  Diagnosis Date  . Ovarian cyst   . Thyroid enlarged    Past Surgical History  Procedure Laterality Date  . No past surgeries     No family history on file. History  Substance Use Topics  . Smoking status: Never Smoker   . Smokeless tobacco: Not on file  . Alcohol Use: 1.2 oz/week    2 Cans of beer per week   OB History   Grav Para Term Preterm Abortions TAB SAB Ect Mult Living   0         0     Review of Systems  Constitutional: Negative for fever and chills.  Gastrointestinal: Positive for abdominal pain.  Genitourinary: Positive for flank pain and vaginal discharge. Negative for dysuria, urgency, frequency and hematuria.  All other systems reviewed and are negative.     Allergies  Shellfish allergy  Home Medications   Prior to Admission medications   Medication Sig Start Date End Date Taking? Authorizing Provider  acetaminophen (TYLENOL) 500 MG tablet  Take 1,000 mg by mouth every 6 (six) hours as needed for pain.    Historical Provider, MD  ciprofloxacin (CIPRO) 500 MG tablet Take 1 tablet (500 mg total) by mouth every 12 (twelve) hours. 06/19/13   Teressa Lower, MD  ibuprofen (ADVIL,MOTRIN) 200 MG tablet Take 600 mg by mouth every 6 (six) hours as needed for pain. For pain.    Historical Provider, MD  naproxen sodium (ANAPROX) 220 MG tablet Take 440 mg by mouth 2 (two) times daily as needed (for pain).    Historical Provider, MD  promethazine (PHENERGAN) 25 MG tablet Take 1 tablet (25 mg total) by mouth every 6 (six) hours as needed for nausea. 06/19/13   Teressa Lower, MD  traMADol (ULTRAM) 50 MG tablet Take 1 tablet (50 mg total) by mouth every 6 (six) hours as needed for pain. 06/19/13   Teressa Lower, MD   Triage Vitals: BP 118/64  Pulse 82  Temp(Src) 98.4 F (36.9 C) (Oral)  Resp 16  Ht 5\' 4"  (1.626 m)  Wt 150 lb (68.04 kg)  BMI 25.73 kg/m2  SpO2 99%  LMP 08/23/2014  Physical Exam  Nursing note and vitals reviewed. Constitutional: She is oriented to person, place, and time. She appears well-developed and well-nourished. No distress.  HENT:  Head: Normocephalic and atraumatic.  Eyes: Conjunctivae and EOM are normal.  Neck: Neck supple. No  tracheal deviation present.  Cardiovascular: Normal rate.   Pulmonary/Chest: Effort normal. No respiratory distress.  Abdominal: Soft. Bowel sounds are normal. She exhibits no distension. There is no tenderness. There is no rebound and no guarding.  Right CVA tenderness  Genitourinary:  Normal external genitalia. Normal vaginal canal. Small brown discharge. Cervix is normal, closed. No CMT. No uterine or adnexal tenderness. No masses palpated.    Musculoskeletal: Normal range of motion.  Neurological: She is alert and oriented to person, place, and time.  Skin: Skin is warm and dry.  Psychiatric: She has a normal mood and affect. Her behavior is normal.    ED Course  Procedures (including  critical care time) DIAGNOSTIC STUDIES: Oxygen Saturation is 99% on RA, normal by my interpretation.    COORDINATION OF CARE: 11:01 PM-Discussed treatment plan which includes CBC panel, CMP, UA with pt at bedside and pt agreed to plan.     Labs Review Labs Reviewed  CBC WITH DIFFERENTIAL - Abnormal; Notable for the following:    Hemoglobin 11.8 (*)    HCT 35.8 (*)    Monocytes Relative 16 (*)    Monocytes Absolute 1.3 (*)    All other components within normal limits  COMPREHENSIVE METABOLIC PANEL - Abnormal; Notable for the following:    Potassium 3.6 (*)    Total Bilirubin <0.2 (*)    All other components within normal limits  URINALYSIS, ROUTINE W REFLEX MICROSCOPIC  PREGNANCY, URINE    Imaging Review No results found.   EKG Interpretation None      MDM   Final diagnoses:  Right flank pain      Patient with right CVA tenderness, right flank pain. Pain is worsened with movement. Also states feels like prior pyelonephritis. Lab work obtained at triage and is unremarkable. Urinalysis is negative. Pelvic exam performed, unremarkable other than mild spotting. Wet prep unremarkable as well. Cultures for GC chlamydia pending. At this time I suspect her flank pain may be low. Considered kidney stone versus cholelithiasis. Will treat with pain management at this time, given precautions to return if symptoms are worsening or if she develops fever, nausea, vomiting. Otherwise followup with primary care Dr.  Danley Danker Vitals:   09/12/14 2130  BP: 118/64  Pulse: 82  Temp: 98.4 F (36.9 C)  TempSrc: Oral  Resp: 16  Height: 5\' 4"  (1.626 m)  Weight: 150 lb (68.04 kg)  SpO2: 99%    I personally performed the services described in this documentation, which was scribed in my presence. The recorded information has been reviewed and is accurate.   Renold Genta, PA-C 09/13/14 0132  Renold Genta, PA-C 09/13/14 [phone]

## 2014-09-13 LAB — URINE MICROSCOPIC-ADD ON

## 2014-09-13 LAB — WET PREP, GENITAL
Trich, Wet Prep: NONE SEEN
YEAST WET PREP: NONE SEEN

## 2014-09-13 LAB — URINALYSIS, ROUTINE W REFLEX MICROSCOPIC
BILIRUBIN URINE: NEGATIVE
Glucose, UA: NEGATIVE mg/dL
KETONES UR: NEGATIVE mg/dL
Leukocytes, UA: NEGATIVE
NITRITE: NEGATIVE
PH: 5.5 (ref 5.0–8.0)
Protein, ur: NEGATIVE mg/dL
Specific Gravity, Urine: 1.03 (ref 1.005–1.030)
Urobilinogen, UA: 0.2 mg/dL (ref 0.0–1.0)

## 2014-09-13 LAB — HIV ANTIBODY (ROUTINE TESTING W REFLEX): HIV 1&2 Ab, 4th Generation: NONREACTIVE

## 2014-09-13 LAB — PREGNANCY, URINE: Preg Test, Ur: NEGATIVE

## 2014-09-13 LAB — RPR

## 2014-09-13 MED ORDER — HYDROCODONE-ACETAMINOPHEN 5-325 MG PO TABS
1.0000 | ORAL_TABLET | Freq: Once | ORAL | Status: AC
  Administered 2014-09-13: 1 via ORAL
  Filled 2014-09-13: qty 1

## 2014-09-13 MED ORDER — HYDROCODONE-ACETAMINOPHEN 5-325 MG PO TABS: 1.0000 | ORAL_TABLET | Freq: Four times a day (QID) | ORAL | Status: DC | PRN

## 2014-09-13 NOTE — ED Notes (Signed)
Patient relocated to bigger room with stretcher to perform pelvic exam.

## 2014-09-13 NOTE — ED Notes (Signed)
Discussed patient request with Lahoma Rocker, [place]

## 2014-09-13 NOTE — Discharge Instructions (Signed)
Take pain medication as prescribed as needed. He workup today is unremarkable. There is no evidence of a kidney infection. Your lab work looks good. Follow with primary care Dr. if symptoms continue. Return if your symptoms are worsening  Flank Pain Flank pain refers to pain that is located on the side of the body between the upper abdomen and the back. The pain may occur over a short period of time (acute) or may be long-term or reoccurring (chronic). It may be mild or severe. Flank pain can be caused by many things. CAUSES  Some of the more common causes of flank pain include:  Muscle strains.   Muscle spasms.   A disease of your spine (vertebral disk disease).   A lung infection (pneumonia).   Fluid around your lungs (pulmonary edema).   A kidney infection.   Kidney stones.   A very painful skin rash caused by the chickenpox virus (shingles).   Gallbladder disease.  [place] care will depend on the cause of your pain. In general,  Rest as directed by your caregiver.  Drink enough fluids to keep your urine clear or pale yellow.  Only take over-the-counter or prescription medicines as directed by your caregiver. Some medicines may help relieve the pain.  Tell your caregiver about any changes in your pain.  Follow up with your caregiver as directed. SEEK IMMEDIATE MEDICAL CARE IF:   Your pain is not controlled with medicine.   You have new or worsening symptoms.  Your pain increases.   You have abdominal pain.   You have shortness of breath.   You have persistent nausea or vomiting.   You have swelling in your abdomen.   You feel faint or pass out.   You have blood in your urine.  You have a fever or persistent symptoms for more than 2-3 days.  You have a fever and your symptoms suddenly get worse. MAKE SURE YOU:   Understand these instructions.  Will watch your condition.  Will get help right away if you are not  doing well or get worse. Document Released: 01/11/2006 Document Revised: 08/14/2012 Document Reviewed: 07/04/2012 [facility] Patient Information 2015 [place], [place]. This information is not intended to replace advice given to you by your health care provider. Make sure you discuss any questions you have with your health care provider.

## 2014-09-13 NOTE — ED Notes (Signed)
Patient would like pain medication before she leaves. Ride is driving her home.

## 2014-09-14 LAB — GC/CHLAMYDIA PROBE AMP
CT Probe RNA: POSITIVE — AB
GC Probe RNA: NEGATIVE

## 2014-09-14 NOTE — ED Provider Notes (Signed)
Medical screening examination/treatment/procedure(s) were performed by non-physician practitioner and as supervising physician I was immediately available for consultation/collaboration.   Leota Jacobsen, MD 09/14/14 [phone]

## 2014-09-15 ENCOUNTER — Telehealth (HOSPITAL_COMMUNITY): Payer: Self-pay

## 2014-09-15 NOTE — ED Notes (Signed)
Positive for chlamydia- chart sent to EDP for review.

## 2014-09-21 ENCOUNTER — Telehealth (HOSPITAL_COMMUNITY): Payer: Self-pay

## 2015-09-19 ENCOUNTER — Emergency Department (HOSPITAL_COMMUNITY)
Admission: EM | Admit: 2015-09-19 | Discharge: 2015-09-19 | Disposition: A | Discharge status: Home / Self Care | Payer: PRIVATE HEALTH INSURANCE | Attending: Emergency Medicine | Admitting: Emergency Medicine

## 2015-09-19 ENCOUNTER — Emergency Department (HOSPITAL_COMMUNITY): Payer: PRIVATE HEALTH INSURANCE

## 2015-09-19 ENCOUNTER — Encounter (HOSPITAL_COMMUNITY): Payer: Self-pay

## 2015-09-19 DIAGNOSIS — Y998 Other external cause status: Secondary | ICD-10-CM | POA: Insufficient documentation

## 2015-09-19 DIAGNOSIS — S82892A Other fracture of left lower leg, initial encounter for closed fracture: Secondary | ICD-10-CM

## 2015-09-19 DIAGNOSIS — Y9289 Other specified places as the place of occurrence of the external cause: Secondary | ICD-10-CM | POA: Insufficient documentation

## 2015-09-19 DIAGNOSIS — Z8742 Personal history of other diseases of the female genital tract: Secondary | ICD-10-CM | POA: Insufficient documentation

## 2015-09-19 DIAGNOSIS — Y9389 Activity, other specified: Secondary | ICD-10-CM | POA: Insufficient documentation

## 2015-09-19 DIAGNOSIS — X58XXXA Exposure to other specified factors, initial encounter: Secondary | ICD-10-CM | POA: Insufficient documentation

## 2015-09-19 DIAGNOSIS — S8265XA Nondisplaced fracture of lateral malleolus of left fibula, initial encounter for closed fracture: Secondary | ICD-10-CM | POA: Insufficient documentation

## 2015-09-19 DIAGNOSIS — Z8639 Personal history of other endocrine, nutritional and metabolic disease: Secondary | ICD-10-CM | POA: Insufficient documentation

## 2015-09-19 MED ORDER — IBUPROFEN 400 MG PO TABS
600.0000 mg | ORAL_TABLET | Freq: Once | ORAL | Status: AC
  Administered 2015-09-19: 600 mg via ORAL
  Filled 2015-09-19: qty 2

## 2015-09-19 MED ORDER — HYDROCODONE-ACETAMINOPHEN 5-325 MG PO TABS: 2.0000 | ORAL_TABLET | ORAL | Status: DC | PRN

## 2015-09-19 MED ORDER — IBUPROFEN 400 MG PO TABS: 400.0000 mg | ORAL_TABLET | Freq: Four times a day (QID) | ORAL | Status: DC | PRN

## 2015-09-19 NOTE — ED Notes (Signed)
PA to see and assess patient before RN assessment. See PA assessment.

## 2015-09-19 NOTE — Discharge Instructions (Signed)
You were evaluated in the ED today for your left ankle pain. Your found to have a small avulsion fracture (broken bone) on your left ankle. Please wear your brace as needed for comfort. Follow-up with your doctor as needed. This bone should heal on its own. Take her pain medicines as needed, but do not take these medicines before or while driving. Return to ED for worsening symptoms  Walking Boot A walking boot (controlled ankle motion boot or CAM walker) is a removable boot-shaped splint that holds your foot or ankle in place after an injury or a medical procedure. This helps with healing and prevents further injury. A walking boot has a stiff, rigid outer frame that limits movement and supports your leg and foot. The inner lining is a layer of padded material. Walking boots usually have several adjustable straps to secure them over the foot. Your health care provider may prescribe a walking boot if it is okay for you to use your injured foot to support your body weight. How much you can walk with the boot on will depend on the type and severity of your injury. Your health care provider will recommend the best boot for you based on your condition. HOW DO I PUT ON MY WALKING BOOT? There are different types of walking boots. Each type of boot has specific instructions about how to wear it properly. Follow instructions from your health care provider about wearing yours. In general:  Sit down to put on your boot. This is more comfortable and it helps to prevent falls.  Open up the boot fully. Place your foot into the boot so that your heel rests against the back.  Your toes should be supported by the base of the boot, but they should not hang over the front.  Adjust the straps so the boot fits securely but is not too tight.  Do not bend the hard frame of the boot to get a good fit.  Ask someone to help you put on the boot, if needed. WHAT ARE SOME TIPS FOR WALKING WITH A WALKING BOOT?  Do not try  to walk without wearing the boot unless your health care provider has approved.  Rest your injured leg as much as possible.  Use other assistive walking devices as told by your health care provider. These include crutches and canes.  On your other foot, wear a shoe with a heel that is close to the height of the boot.  Be very careful when walking on surfaces that are uneven or wet. HOW CAN I REDUCE SWELLING?  Rest your injured foot or leg as much as possible.  If directed, apply ice to the injured area:  Put ice in a plastic bag.  Place a towel between your skin and the bag.  Leave the ice on for 20 minutes, 2-3 times a day for two days or as told by your health care provider.  Keep your injured leg raised (elevated) above the level of your heart for 2-3 hours each day or as told by your health care provider.  If swelling gets worse, loosen the boot and rest and raise your foot.  Contact your health care provider if swelling does not get better or if it gets worse over time. WHAT SKIN CARE PRACTICES SHOULD I FOLLOW?  Wear a long sock to protect your foot and leg from rubbing inside the boot.  Take off the boot one time per day to check the injured area.  Follow instructions  from your health care provider about taking care of your incision or wound, if this applies.  Clean and wash the injured area as told by your health care provider.  Gently dry your foot and leg before putting the boot back on.  Contact your health care provider if a wound is getting worse or if your skin becomes red, painful, or irritated. ARE THERE ANY ACTIVITY RESTRICTIONS? Activity restrictions depend on the type and severity of your injury. Follow instructions from your health care provider.  Bathe and shower as directed by your health care provider.  Do not do activities that could make your injury worse.  Do not drive if your affected foot is one that you usually use for driving. HOW SHOULD I  KEEP MY BOOT CLEAN?  Clean the frame and the liner of the boot by hand. Use a washcloth with mild soap and water.  Do not use chemical cleaning products. These could irritate your skin, especially if you have a wound or an incision.  Do not soak the liner of the boot.  Do not put any part of the boot in a washing machine or a clothes dryer.  Allow the boot to air dry completely before you put it back on your foot.   This information is not intended to replace advice given to you by your health care provider. Make sure you discuss any questions you have with your health care provider.   Document Released: 04/06/2015 Document Reviewed: 04/06/2015 Elsevier Interactive Patient Education [employer].

## 2015-09-19 NOTE — ED Provider Notes (Signed)
CSN: [id number]     Arrival date & time 09/19/15  1908 History   First MD Initiated Contact with Patient 09/19/15 1924     Chief Complaint  Patient presents with  . Ankle Pain     (Consider location/radiation/quality/duration/timing/severity/associated sxs/prior Treatment) HPI Ana Moore is a 29 y.o. female who comes in for evaluation of left ankle pain. Patient reports she was walking yesterday and believes she may have twisted her left ankle. She took one extra strength Tylenol without relief of her symptoms. She denies any fevers, chills, numbness or weakness, cool extremities. Characterizes pain as sudden onset and rates it as a 10/10. Also, denies any discomfort at rest. No other modifying factors.  Past Medical History  Diagnosis Date  . Ovarian cyst   . Thyroid enlarged    Past Surgical History  Procedure Laterality Date  . No past surgeries     No family history on file. Social History  Substance Use Topics  . Smoking status: Never Smoker   . Smokeless tobacco: None  . Alcohol Use: 1.2 oz/week    2 Cans of beer per week   OB History    Gravida Para Term Preterm AB TAB SAB Ectopic Multiple Living   0         0     Review of Systems A 10 point review of systems was completed and was negative except for pertinent positives and negatives as mentioned in the history of present illness     Allergies  Shellfish allergy  Home Medications   Prior to Admission medications   Medication Sig Start Date End Date Taking? Authorizing Provider  acetaminophen (TYLENOL) 500 MG tablet Take 1,000 mg by mouth every 6 (six) hours as needed for pain.    Historical Provider, MD  HYDROcodone-acetaminophen (NORCO/VICODIN) 5-325 MG tablet Take 2 tablets by mouth every 4 (four) hours as needed. 09/19/15   Comer Locket, PA-C  ibuprofen (ADVIL,MOTRIN) 400 MG tablet Take 1 tablet (400 mg total) by mouth every 6 (six) hours as needed. 09/19/15   Abimelec Grochowski, PA-C   BP 120/78  mmHg  Pulse 73  Temp(Src) 98.4 F (36.9 C) (Oral)  Resp 16  Ht 5\' 4"  (1.626 m)  Wt 150 lb (68.04 kg)  BMI 25.73 kg/m2  SpO2 99% Physical Exam  Constitutional:  Awake, alert, nontoxic appearance.  HENT:  Head: Atraumatic.  Eyes: Right eye exhibits no discharge. Left eye exhibits no discharge.  Neck: Neck supple.  Pulmonary/Chest: Effort normal. She exhibits no tenderness.  Abdominal: Soft. There is no tenderness. There is no rebound.  Musculoskeletal: She exhibits no tenderness.  Range of motion of left ankle decreased secondary to pain. Tenderness to palpation on posterior aspect of lateral malleolus. Full range of motion of left knee and no tenderness at the fibular head. Mild diffuse swelling around left ankle. Distal pulses intact.  Neurological:  Mental status and motor strength appears baseline for patient and situation. Sensation intact to light touch.  Skin: No rash noted.  Psychiatric: She has a normal mood and affect.  Nursing note and vitals reviewed.   ED Course  Procedures (including critical care time) Labs Review Labs Reviewed - No data to display  Imaging Review Dg Ankle Complete Left  09/19/2015  CLINICAL DATA:  Lateral malleolus ankle pain after rolling and 1 day ago. Initial encounter. EXAM: LEFT ANKLE COMPLETE - 3+ VIEW COMPARISON:  10/03/2012 left foot radiography FINDINGS: Avulsion fracture from the tip of the lateral malleolus, nondisplaced.  Normal ankle alignment. IMPRESSION: Lateral malleolus avulsion fracture, nondisplaced. Electronically Signed   By: Monte Fantasia M.D.   On: 09/19/2015 20:56   I have personally reviewed and evaluated these images and lab results as part of my medical decision-making.   EKG Interpretation None     --Patient refuses icepack. MDM  Vitals stable - WNL -afebrile Pt resting comfortably in ED. PE--physical exam as above tenderness to left ankle lateral malleolus. Neurovascularly intact. Will obtain plain films of  the ankle Imaging-plain films of left ankle show a small avulsion fracture to distal aspect of left malleolus.  Patient presents for ventilation left ankle pain. Found to have small [place] fracture. Placed in [place] for comfort. DC with short course pain medicines. Encouraged continued use of Motrin/Tylenol at home. Instructions to follow-up with PCP as needed. No evidence of other acute or emergent pathology at this time.  I discussed all relevant lab findings and imaging results with pt and they verbalized understanding. Discussed f/u with PCP within 48 hrs and return precautions, pt very amenable to plan.  Final diagnoses:  Avulsion fracture of ankle, left, closed, initial encounter        Comer Locket, PA-C 09/19/15 2140  Charlesetta Shanks, MD 09/25/15 [phone]

## 2015-09-19 NOTE — ED Notes (Signed)
Pt reports left ankle pain, was walking in the woods last night and states she twisted it. Mild swelling to left lateral ankle.

## 2016-03-27 ENCOUNTER — Emergency Department (HOSPITAL_COMMUNITY): Payer: Self-pay

## 2016-03-27 ENCOUNTER — Emergency Department (HOSPITAL_COMMUNITY): Payer: Self-pay | Admitting: Anesthesiology

## 2016-03-27 ENCOUNTER — Encounter (HOSPITAL_COMMUNITY): Payer: Self-pay | Admitting: Emergency Medicine

## 2016-03-27 ENCOUNTER — Inpatient Hospital Stay (HOSPITAL_COMMUNITY)
Admission: EM | Admit: 2016-03-27 | Discharge: 2016-04-01 | DRG: 501 | Disposition: A | Discharge status: Home / Self Care | Payer: Self-pay | Attending: Surgery | Admitting: Surgery

## 2016-03-27 ENCOUNTER — Encounter (HOSPITAL_COMMUNITY): Admission: EM | Disposition: A | Discharge status: Home / Self Care | Payer: Self-pay | Source: Home / Self Care

## 2016-03-27 DIAGNOSIS — S62306A Unspecified fracture of fifth metacarpal bone, right hand, initial encounter for closed fracture: Secondary | ICD-10-CM | POA: Diagnosis present

## 2016-03-27 DIAGNOSIS — S81011A Laceration without foreign body, right knee, initial encounter: Secondary | ICD-10-CM | POA: Diagnosis present

## 2016-03-27 DIAGNOSIS — S62616A Displaced fracture of proximal phalanx of right little finger, initial encounter for closed fracture: Secondary | ICD-10-CM | POA: Diagnosis present

## 2016-03-27 DIAGNOSIS — Z23 Encounter for immunization: Secondary | ICD-10-CM

## 2016-03-27 DIAGNOSIS — S0181XA Laceration without foreign body of other part of head, initial encounter: Secondary | ICD-10-CM | POA: Diagnosis present

## 2016-03-27 DIAGNOSIS — D62 Acute posthemorrhagic anemia: Secondary | ICD-10-CM | POA: Diagnosis present

## 2016-03-27 DIAGNOSIS — Y92410 Unspecified street and highway as the place of occurrence of the external cause: Secondary | ICD-10-CM

## 2016-03-27 DIAGNOSIS — S52611A Displaced fracture of right ulna styloid process, initial encounter for closed fracture: Secondary | ICD-10-CM | POA: Diagnosis present

## 2016-03-27 DIAGNOSIS — S62336A Displaced fracture of neck of fifth metacarpal bone, right hand, initial encounter for closed fracture: Secondary | ICD-10-CM | POA: Diagnosis present

## 2016-03-27 DIAGNOSIS — S161XXA Strain of muscle, fascia and tendon at neck level, initial encounter: Secondary | ICD-10-CM | POA: Diagnosis present

## 2016-03-27 DIAGNOSIS — S62102A Fracture of unspecified carpal bone, left wrist, initial encounter for closed fracture: Secondary | ICD-10-CM | POA: Diagnosis present

## 2016-03-27 DIAGNOSIS — Z452 Encounter for adjustment and management of vascular access device: Secondary | ICD-10-CM

## 2016-03-27 DIAGNOSIS — S52502A Unspecified fracture of the lower end of left radius, initial encounter for closed fracture: Principal | ICD-10-CM | POA: Diagnosis present

## 2016-03-27 HISTORY — DX: Other ovarian cyst, unspecified side: N83.299

## 2016-03-27 HISTORY — PX: I&D EXTREMITY: SHX5045

## 2016-03-27 LAB — CBC
HCT: 37.4 % (ref 36.0–46.0)
Hemoglobin: 11.8 g/dL — ABNORMAL LOW (ref 12.0–15.0)
MCH: 26.6 pg (ref 26.0–34.0)
MCHC: 31.6 g/dL (ref 30.0–36.0)
MCV: 84.2 fL (ref 78.0–100.0)
PLATELETS: 347 10*3/uL (ref 150–400)
RBC: 4.44 MIL/uL (ref 3.87–5.11)
RDW: 14.6 % (ref 11.5–15.5)
WBC: 11.6 10*3/uL — ABNORMAL HIGH (ref 4.0–10.5)

## 2016-03-27 LAB — COMPREHENSIVE METABOLIC PANEL
ALBUMIN: 3.8 g/dL (ref 3.5–5.0)
ALT: 11 U/L — AB (ref 14–54)
AST: 22 U/L (ref 15–41)
Alkaline Phosphatase: 67 U/L (ref 38–126)
Anion gap: 11 (ref 5–15)
BUN: 8 mg/dL (ref 6–20)
CHLORIDE: 109 mmol/L (ref 101–111)
CO2: 19 mmol/L — AB (ref 22–32)
CREATININE: 0.51 mg/dL (ref 0.44–1.00)
Calcium: 8.6 mg/dL — ABNORMAL LOW (ref 8.9–10.3)
GFR calc Af Amer: >60 mL/min (ref >60)
GFR calc non Af Amer: >60 mL/min (ref >60)
Glucose, Bld: 122 mg/dL — ABNORMAL HIGH (ref 65–99)
Potassium: 3.6 mmol/L (ref 3.5–5.1)
SODIUM: 139 mmol/L (ref 135–145)
Total Bilirubin: 0.3 mg/dL (ref 0.3–1.2)
Total Protein: 6.9 g/dL (ref 6.5–8.1)

## 2016-03-27 LAB — ETHANOL: Alcohol, Ethyl (B): <5 mg/dL (ref <5)

## 2016-03-27 LAB — PROTIME-INR
INR: 1.17 (ref 0.00–1.49)
Prothrombin Time: 15.1 seconds (ref 11.6–15.2)

## 2016-03-27 LAB — I-STAT BETA HCG BLOOD, ED (MC, WL, AP ONLY)

## 2016-03-27 LAB — CDS SEROLOGY

## 2016-03-27 LAB — SAMPLE TO BLOOD BANK

## 2016-03-27 SURGERY — IRRIGATION AND DEBRIDEMENT EXTREMITY
Anesthesia: General | Site: Knee | Laterality: Right

## 2016-03-27 MED ORDER — ONDANSETRON HCL 4 MG/2ML IJ SOLN
INTRAMUSCULAR | Status: AC
  Filled 2016-03-27: qty 2

## 2016-03-27 MED ORDER — IOPAMIDOL (ISOVUE-300) INJECTION 61%
INTRAVENOUS | Status: AC
  Administered 2016-03-27: 100 mL
  Filled 2016-03-27: qty 100

## 2016-03-27 MED ORDER — OXYCODONE HCL 5 MG PO TABS: 10.0000 mg | ORAL_TABLET | ORAL | Status: DC | PRN

## 2016-03-27 MED ORDER — ONDANSETRON HCL 4 MG/2ML IJ SOLN
4.0000 mg | Freq: Once | INTRAMUSCULAR | Status: AC | PRN
  Administered 2016-03-27: 4 mg via INTRAVENOUS
  Filled 2016-03-27: qty 2

## 2016-03-27 MED ORDER — LIDOCAINE HCL (CARDIAC) 20 MG/ML IV SOLN
INTRAVENOUS | Status: DC | PRN
  Administered 2016-03-27: 80 mg via INTRAVENOUS

## 2016-03-27 MED ORDER — HYDROMORPHONE HCL 1 MG/ML IJ SOLN
INTRAMUSCULAR | Status: AC
  Filled 2016-03-27: qty 1

## 2016-03-27 MED ORDER — DEXAMETHASONE SODIUM PHOSPHATE 4 MG/ML IJ SOLN
INTRAMUSCULAR | Status: DC | PRN
  Administered 2016-03-27: 4 mg via INTRAVENOUS

## 2016-03-27 MED ORDER — LIDOCAINE HCL (CARDIAC) 20 MG/ML IV SOLN
INTRAVENOUS | Status: AC
  Filled 2016-03-27: qty 5

## 2016-03-27 MED ORDER — PROPOFOL 10 MG/ML IV BOLUS
INTRAVENOUS | Status: DC | PRN
  Administered 2016-03-27: 150 mg via INTRAVENOUS
  Administered 2016-03-27: 50 mg via INTRAVENOUS

## 2016-03-27 MED ORDER — METOCLOPRAMIDE HCL 5 MG/ML IJ SOLN
INTRAMUSCULAR | Status: AC
  Filled 2016-03-27: qty 2

## 2016-03-27 MED ORDER — FENTANYL CITRATE (PF) 250 MCG/5ML IJ SOLN
INTRAMUSCULAR | Status: AC
  Filled 2016-03-27: qty 5

## 2016-03-27 MED ORDER — BUPIVACAINE HCL 0.5 % IJ SOLN
50.0000 mL | Freq: Once | INTRAMUSCULAR | Status: AC
  Administered 2016-03-27: 50 mL
  Filled 2016-03-27: qty 50

## 2016-03-27 MED ORDER — FENTANYL CITRATE (PF) 100 MCG/2ML IJ SOLN
INTRAMUSCULAR | Status: DC | PRN
  Administered 2016-03-27: 100 ug via INTRAVENOUS
  Administered 2016-03-27: 50 ug via INTRAVENOUS

## 2016-03-27 MED ORDER — SODIUM CHLORIDE 0.9 % IV BOLUS (SEPSIS)
1000.0000 mL | Freq: Once | INTRAVENOUS | Status: AC
  Administered 2016-03-27: 1000 mL via INTRAVENOUS

## 2016-03-27 MED ORDER — HYDROMORPHONE HCL 1 MG/ML IJ SOLN
1.0000 mg | Freq: Once | INTRAMUSCULAR | Status: AC
  Administered 2016-03-27: 1 mg via INTRAVENOUS
  Filled 2016-03-27: qty 1

## 2016-03-27 MED ORDER — METOCLOPRAMIDE HCL 5 MG/ML IJ SOLN
5.0000 mg | Freq: Once | INTRAMUSCULAR | Status: AC
  Administered 2016-03-27: 5 mg via INTRAVENOUS

## 2016-03-27 MED ORDER — CEFAZOLIN SODIUM-DEXTROSE 2-3 GM-% IV SOLR
INTRAVENOUS | Status: DC | PRN
  Administered 2016-03-27: 2 g via INTRAVENOUS

## 2016-03-27 MED ORDER — PROPOFOL 10 MG/ML IV BOLUS
INTRAVENOUS | Status: AC
  Filled 2016-03-27: qty 20

## 2016-03-27 MED ORDER — HYDROMORPHONE HCL 1 MG/ML IJ SOLN
0.2500 mg | INTRAMUSCULAR | Status: DC | PRN
  Administered 2016-03-27 (×2): 0.5 mg via INTRAVENOUS

## 2016-03-27 MED ORDER — MIDAZOLAM HCL 5 MG/5ML IJ SOLN
INTRAMUSCULAR | Status: DC | PRN
  Administered 2016-03-27: 2 mg via INTRAVENOUS

## 2016-03-27 MED ORDER — SUCCINYLCHOLINE CHLORIDE 200 MG/10ML IV SOSY
PREFILLED_SYRINGE | INTRAVENOUS | Status: DC | PRN
  Administered 2016-03-27: 100 mg via INTRAVENOUS

## 2016-03-27 MED ORDER — CEFAZOLIN SODIUM-DEXTROSE 2-4 GM/100ML-% IV SOLN
2.0000 g | Freq: Once | INTRAVENOUS | Status: AC
  Administered 2016-03-27: 2 g via INTRAVENOUS
  Filled 2016-03-27: qty 100

## 2016-03-27 MED ORDER — DEXTROSE-NACL 5-0.9 % IV SOLN
INTRAVENOUS | Status: DC
  Administered 2016-03-28: 01:00:00 via INTRAVENOUS

## 2016-03-27 MED ORDER — MIDAZOLAM HCL 2 MG/2ML IJ SOLN
INTRAMUSCULAR | Status: AC
  Filled 2016-03-27: qty 2

## 2016-03-27 MED ORDER — HYDROMORPHONE HCL 1 MG/ML IJ SOLN: 1.0000 mg | Freq: Once | INTRAMUSCULAR | Status: DC

## 2016-03-27 MED ORDER — ONDANSETRON HCL 4 MG PO TABS
4.0000 mg | ORAL_TABLET | Freq: Four times a day (QID) | ORAL | Status: DC | PRN
  Filled 2016-03-27: qty 1

## 2016-03-27 MED ORDER — DEXTROSE-NACL 5-0.45 % IV SOLN: 100.0000 mL/h | INTRAVENOUS | Status: DC

## 2016-03-27 MED ORDER — SODIUM CHLORIDE 0.9 % IR SOLN
Status: DC | PRN
  Administered 2016-03-27: 3000 mL

## 2016-03-27 MED ORDER — TETANUS-DIPHTH-ACELL PERTUSSIS 5-2.5-18.5 LF-MCG/0.5 IM SUSP
0.5000 mL | Freq: Once | INTRAMUSCULAR | Status: AC
  Administered 2016-03-27: 0.5 mL via INTRAMUSCULAR
  Filled 2016-03-27: qty 0.5

## 2016-03-27 MED ORDER — DEXAMETHASONE SODIUM PHOSPHATE 4 MG/ML IJ SOLN
INTRAMUSCULAR | Status: AC
  Filled 2016-03-27: qty 1

## 2016-03-27 MED ORDER — ONDANSETRON HCL 4 MG/2ML IJ SOLN
4.0000 mg | Freq: Once | INTRAMUSCULAR | Status: AC
  Administered 2016-03-27: 4 mg via INTRAVENOUS
  Filled 2016-03-27: qty 2

## 2016-03-27 MED ORDER — ONDANSETRON HCL 4 MG/2ML IJ SOLN
4.0000 mg | Freq: Four times a day (QID) | INTRAMUSCULAR | Status: DC | PRN
  Administered 2016-03-28: 4 mg via INTRAVENOUS
  Filled 2016-03-27 (×3): qty 2

## 2016-03-27 MED ORDER — HYDROMORPHONE HCL 1 MG/ML IJ SOLN
1.0000 mg | INTRAMUSCULAR | Status: DC | PRN
  Administered 2016-03-28 (×2): 1 mg via INTRAVENOUS
  Filled 2016-03-27 (×2): qty 1

## 2016-03-27 MED ORDER — LACTATED RINGERS IV SOLN
INTRAVENOUS | Status: DC | PRN
  Administered 2016-03-27: 22:00:00 via INTRAVENOUS

## 2016-03-27 MED ORDER — ONDANSETRON HCL 4 MG/2ML IJ SOLN
INTRAMUSCULAR | Status: DC | PRN
  Administered 2016-03-27: 4 mg via INTRAVENOUS

## 2016-03-27 SURGICAL SUPPLY — 43 items
BANDAGE ACE 3X5.8 VEL STRL LF (GAUZE/BANDAGES/DRESSINGS) ×3 IMPLANT
BANDAGE ACE 6X5 VEL STRL LF (GAUZE/BANDAGES/DRESSINGS) ×3 IMPLANT
BANDAGE ELASTIC 4 VELCRO ST LF (GAUZE/BANDAGES/DRESSINGS) ×3 IMPLANT
BANDAGE ELASTIC 6 VELCRO ST LF (GAUZE/BANDAGES/DRESSINGS) ×3 IMPLANT
BLADE SURG 10 STRL SS (BLADE) ×3 IMPLANT
BNDG COHESIVE 4X5 TAN STRL (GAUZE/BANDAGES/DRESSINGS) ×3 IMPLANT
BNDG GAUZE ELAST 4 BULKY (GAUZE/BANDAGES/DRESSINGS) ×3 IMPLANT
CLOSURE STERI-STRIP 1/4X4 (GAUZE/BANDAGES/DRESSINGS) ×3 IMPLANT
CLOSURE WOUND 1/4X4 (GAUZE/BANDAGES/DRESSINGS) ×1
COVER SURGICAL LIGHT HANDLE (MISCELLANEOUS) ×3 IMPLANT
CUFF TOURNIQUET SINGLE 34IN LL (TOURNIQUET CUFF) IMPLANT
DRSG ADAPTIC 3X8 NADH LF (GAUZE/BANDAGES/DRESSINGS) ×3 IMPLANT
DRSG PAD ABDOMINAL 8X10 ST (GAUZE/BANDAGES/DRESSINGS) ×3 IMPLANT
DURAPREP 26ML APPLICATOR (WOUND CARE) ×3 IMPLANT
ELECT REM PT RETURN 9FT ADLT (ELECTROSURGICAL)
ELECTRODE REM PT RTRN 9FT ADLT (ELECTROSURGICAL) IMPLANT
EVACUATOR 1/8 PVC DRAIN (DRAIN) IMPLANT
GAUZE SPONGE 4X4 12PLY STRL (GAUZE/BANDAGES/DRESSINGS) ×3 IMPLANT
GAUZE XEROFORM 1X8 LF (GAUZE/BANDAGES/DRESSINGS) ×3 IMPLANT
GLOVE BIO SURGEON STRL SZ7 (GLOVE) ×3 IMPLANT
GLOVE BIO SURGEON STRL SZ7.5 (GLOVE) ×3 IMPLANT
GLOVE BIOGEL PI IND STRL 7.0 (GLOVE) ×1 IMPLANT
GLOVE BIOGEL PI INDICATOR 7.0 (GLOVE) ×2
GOWN STRL REUS W/ TWL LRG LVL3 (GOWN DISPOSABLE) ×2 IMPLANT
GOWN STRL REUS W/TWL LRG LVL3 (GOWN DISPOSABLE) ×6
IMMOBILIZER KNEE 22 (SOFTGOODS) ×3 IMPLANT
KIT BASIN OR (CUSTOM PROCEDURE TRAY) ×3 IMPLANT
KIT ROOM TURNOVER OR (KITS) ×3 IMPLANT
MANIFOLD NEPTUNE II (INSTRUMENTS) ×3 IMPLANT
NS IRRIG 1000ML POUR BTL (IV SOLUTION) ×3 IMPLANT
PACK ORTHO EXTREMITY (CUSTOM PROCEDURE TRAY) ×3 IMPLANT
PAD ARMBOARD 7.5X6 YLW CONV (MISCELLANEOUS) ×6 IMPLANT
STOCKINETTE IMPERVIOUS 9X36 MD (GAUZE/BANDAGES/DRESSINGS) ×3 IMPLANT
STRIP CLOSURE SKIN 1/4X4 (GAUZE/BANDAGES/DRESSINGS) ×2 IMPLANT
SUT ETHILON 3 0 PS 1 (SUTURE) ×6 IMPLANT
TOWEL OR 17X24 6PK STRL BLUE (TOWEL DISPOSABLE) ×3 IMPLANT
TOWEL OR 17X26 10 PK STRL BLUE (TOWEL DISPOSABLE) ×3 IMPLANT
TOWEL OR NON WOVEN STRL DISP B (DISPOSABLE) ×3 IMPLANT
TUBE ANAEROBIC SPECIMEN COL (MISCELLANEOUS) IMPLANT
TUBE CONNECTING 12'X1/4 (SUCTIONS) ×1
TUBE CONNECTING 12X1/4 (SUCTIONS) ×2 IMPLANT
UNDERPAD 30X30 INCONTINENT (UNDERPADS AND DIAPERS) ×3 IMPLANT
YANKAUER SUCT BULB TIP NO VENT (SUCTIONS) ×3 IMPLANT

## 2016-03-27 NOTE — ED Notes (Signed)
Patient transported to X-ray 

## 2016-03-27 NOTE — Anesthesia Preprocedure Evaluation (Addendum)
Anesthesia Evaluation  Patient identified by MRN, date of birth, ID band Patient awake    Reviewed: Allergy & Precautions, NPO status , Patient's Chart, lab work & pertinent test results  Airway Mallampati: II  TM Distance: >3 FB Neck ROM: Full    Dental   Pulmonary neg pulmonary ROS,    breath sounds clear to auscultation       Cardiovascular negative cardio ROS   Rhythm:Regular Rate:Normal     Neuro/Psych    GI/Hepatic negative GI ROS, Neg liver ROS,   Endo/Other  negative endocrine ROS  Renal/GU negative Renal ROS     Musculoskeletal   Abdominal   Peds  Hematology   Anesthesia Other Findings   Reproductive/Obstetrics                            Anesthesia Physical Anesthesia Plan  ASA: I  Anesthesia Plan: General   Post-op Pain Management:    Induction: Intravenous, Rapid sequence and Cricoid pressure planned  Airway Management Planned:   Additional Equipment:   Intra-op Plan:   Post-operative Plan: Extubation in OR  Informed Consent:   Dental advisory given  Plan Discussed with: CRNA and Anesthesiologist  Anesthesia Plan Comments:         Anesthesia Quick Evaluation

## 2016-03-27 NOTE — ED Notes (Signed)
Kim in CT aware that patient's beta hcg is resulted and patient is ready for CT
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
## 2016-03-27 NOTE — ED Notes (Signed)
Pt. Was having episodes of nausea and dry heaving. Informed pt. That more nausea medicine was available to be given. Pt. States she did not want any more medicines at this time. Pt. Did request ice pack.
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
## 2016-03-27 NOTE — Transfer of Care (Signed)
Immediate Anesthesia Transfer of Care Note  Patient: Ana Moore  Procedure(s) Performed: Procedure(s) with comments: IRRIGATION AND DEBRIDEMENT RIGHT KNEE, LEFT WRIST SPLINT, RIGHT ULNA/RADIUS SPLINT (Right) - aPPLICATION OF STERI-STRIPS TO LEFT ORBITAL LAC.  Patient Location: PACU  Anesthesia Type:General  Level of Consciousness: awake  Airway & Oxygen Therapy: Patient Spontanous Breathing  Post-op Assessment: Report given to RN and Post -op Vital signs reviewed and stable  Post vital signs: Reviewed and stable  Last Vitals:  Filed Vitals:   03/27/16 2030 03/27/16 2045  BP: 122/68 120/62  Pulse: 77 72  Temp:    Resp: 18 18    Complications: No apparent anesthesia complications
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
## 2016-03-27 NOTE — Anesthesia Procedure Notes (Signed)
Procedure Name: Intubation Date/Time: 03/27/2016 10:13 PM Performed by: Manus Gunning, Maclin Guerrette J Pre-anesthesia Checklist: Patient identified, Timeout performed, Emergency Drugs available, Suction available and Patient being monitored Patient Re-evaluated:Patient Re-evaluated prior to inductionOxygen Delivery Method: Circle system utilized Preoxygenation: Pre-oxygenation with 100% oxygen Intubation Type: IV induction and Cricoid Pressure applied Laryngoscope Size: Mac and 3 Grade View: Grade I Tube type: Oral Tube size: 7.0 mm Number of attempts: 1 Placement Confirmation: ETT inserted through vocal cords under direct vision,  positive ETCO2 and breath sounds checked- equal and bilateral Secured at: 21 cm Tube secured with: Tape Dental Injury: Teeth and Oropharynx as per pre-operative assessment

## 2016-03-27 NOTE — Progress Notes (Signed)
Orthopedic Tech Progress Note Patient Details:  Ana Moore February 03, 1986 [id number] Level 2 trauma ortho visit. Patient ID: [name], female   DOB: August 28, 1986, 30 y.o.   MRN: [id number]   Braulio Bosch 03/27/2016, 4:46 PM
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
## 2016-03-27 NOTE — ED Notes (Signed)
Patient transported to CT 

## 2016-03-28 ENCOUNTER — Encounter (HOSPITAL_COMMUNITY): Payer: Self-pay | Admitting: Orthopedic Surgery

## 2016-03-28 DIAGNOSIS — S81011A Laceration without foreign body, right knee, initial encounter: Secondary | ICD-10-CM | POA: Diagnosis present

## 2016-03-28 DIAGNOSIS — S0181XA Laceration without foreign body of other part of head, initial encounter: Secondary | ICD-10-CM | POA: Diagnosis present

## 2016-03-28 DIAGNOSIS — S62616A Displaced fracture of proximal phalanx of right little finger, initial encounter for closed fracture: Secondary | ICD-10-CM | POA: Diagnosis present

## 2016-03-28 DIAGNOSIS — D62 Acute posthemorrhagic anemia: Secondary | ICD-10-CM | POA: Diagnosis not present

## 2016-03-28 DIAGNOSIS — S52611A Displaced fracture of right ulna styloid process, initial encounter for closed fracture: Secondary | ICD-10-CM | POA: Diagnosis present

## 2016-03-28 DIAGNOSIS — S62102A Fracture of unspecified carpal bone, left wrist, initial encounter for closed fracture: Secondary | ICD-10-CM | POA: Diagnosis present

## 2016-03-28 DIAGNOSIS — S62306A Unspecified fracture of fifth metacarpal bone, right hand, initial encounter for closed fracture: Secondary | ICD-10-CM | POA: Diagnosis present

## 2016-03-28 LAB — BASIC METABOLIC PANEL
ANION GAP: 13 (ref 5–15)
BUN: <5 mg/dL — ABNORMAL LOW (ref 6–20)
CALCIUM: 8.4 mg/dL — AB (ref 8.9–10.3)
CO2: 20 mmol/L — ABNORMAL LOW (ref 22–32)
Chloride: 102 mmol/L (ref 101–111)
Creatinine, Ser: 0.68 mg/dL (ref 0.44–1.00)
Glucose, Bld: 196 mg/dL — ABNORMAL HIGH (ref 65–99)
Potassium: 3.6 mmol/L (ref 3.5–5.1)
SODIUM: 135 mmol/L (ref 135–145)

## 2016-03-28 LAB — CBC
HCT: 32.3 % — ABNORMAL LOW (ref 36.0–46.0)
Hemoglobin: 10.1 g/dL — ABNORMAL LOW (ref 12.0–15.0)
MCH: 25.9 pg — ABNORMAL LOW (ref 26.0–34.0)
MCHC: 31.3 g/dL (ref 30.0–36.0)
MCV: 82.8 fL (ref 78.0–100.0)
PLATELETS: 295 10*3/uL (ref 150–400)
RBC: 3.9 MIL/uL (ref 3.87–5.11)
RDW: 14.9 % (ref 11.5–15.5)
WBC: 16.5 10*3/uL — AB (ref 4.0–10.5)

## 2016-03-28 MED ORDER — CEFAZOLIN SODIUM-DEXTROSE 2-4 GM/100ML-% IV SOLN
2.0000 g | Freq: Four times a day (QID) | INTRAVENOUS | Status: AC
  Administered 2016-03-28 (×3): 2 g via INTRAVENOUS
  Filled 2016-03-28 (×3): qty 100

## 2016-03-28 MED ORDER — POLYETHYLENE GLYCOL 3350 17 G PO PACK
17.0000 g | PACK | Freq: Every day | ORAL | Status: DC
  Administered 2016-03-28 – 2016-04-01 (×4): 17 g via ORAL
  Filled 2016-03-28 (×5): qty 1

## 2016-03-28 MED ORDER — ASPIRIN 325 MG PO TABS
325.0000 mg | ORAL_TABLET | Freq: Every day | ORAL | Status: DC
  Administered 2016-03-28 – 2016-04-01 (×5): 325 mg via ORAL
  Filled 2016-03-28 (×5): qty 1

## 2016-03-28 MED ORDER — PROCHLORPERAZINE EDISYLATE 5 MG/ML IJ SOLN
10.0000 mg | Freq: Four times a day (QID) | INTRAMUSCULAR | Status: DC | PRN
  Administered 2016-03-28 – 2016-03-29 (×2): 10 mg via INTRAVENOUS
  Filled 2016-03-28 (×4): qty 2

## 2016-03-28 MED ORDER — DOCUSATE SODIUM 100 MG PO CAPS
100.0000 mg | ORAL_CAPSULE | Freq: Two times a day (BID) | ORAL | Status: DC
  Administered 2016-03-28 – 2016-04-01 (×9): 100 mg via ORAL
  Filled 2016-03-28 (×9): qty 1

## 2016-03-28 MED ORDER — OXYCODONE HCL 5 MG PO TABS
5.0000 mg | ORAL_TABLET | ORAL | Status: DC | PRN
  Administered 2016-03-28: 5 mg via ORAL
  Administered 2016-03-29 – 2016-03-31 (×10): 15 mg via ORAL
  Filled 2016-03-28: qty 3
  Filled 2016-03-28: qty 1
  Filled 2016-03-28 (×9): qty 3
  Filled 2016-03-28: qty 1

## 2016-03-28 MED ORDER — HYDROMORPHONE HCL 1 MG/ML IJ SOLN
0.5000 mg | INTRAMUSCULAR | Status: DC | PRN
  Administered 2016-03-28 – 2016-03-31 (×5): 0.5 mg via INTRAVENOUS
  Filled 2016-03-28 (×5): qty 1
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
## 2016-03-28 NOTE — Op Note (Signed)
03/27/2016  1:18 PM  PATIENT:  [name]    PRE-OPERATIVE DIAGNOSIS:  open right knee injury  POST-OPERATIVE DIAGNOSIS:  Same  PROCEDURE:  IRRIGATION AND DEBRIDEMENT RIGHT KNEE, LEFT WRIST SPLINT, RIGHT ULNA/RADIUS SPLINT  SURGEON:  Deklen Popelka D, MD  ASSISTANT: Joya Gaskins, OPA-C, present and scrubbed throughout the case, critical for completion in a timely fashion, and for retraction, instrumentation, and closure.   ANESTHESIA:   gen  PREOPERATIVE INDICATIONS:  Ana Moore is a  30 y.o. female with a diagnosis of open right knee injury who failed conservative measures and elected for surgical management.    The risks benefits and alternatives were discussed with the patient preoperatively including but not limited to the risks of infection, bleeding, nerve injury, cardiopulmonary complications, the need for revision surgery, among others, and the patient was willing to proceed.  OPERATIVE IMPLANTS: none  OPERATIVE FINDINGS: 18cm laceration, no articular involvement  BLOOD LOSS: min  COMPLICATIONS: none  TOURNIQUET TIME: none  OPERATIVE PROCEDURE:  Patient was identified in the preoperative holding area and site was marked by me She was transported to the operating theater and placed on the table in supine position taking care to pad all bony prominences. After a preincinduction time out anesthesia was induced. The right lower extremity was prepped and draped in normal sterile fashion and a pre-incision timeout was performed. She received ancef for preoperative antibiotics.   I initially performed a thorough irrigation and debridement of her 18 center laceration around her right knee. I probed this thoroughly there did not appear to be any articular involvement it did not penetrate deep to the joint capsule.  I performed an extensive debridement of devitalized tissue skin muscle flap with scissors scalpel and a knife near this proximal fibula fracture.  I  examined the stability of her knee and she did not have any laxity to varus stressing.  This did track down her lateral compartment which was soft.  I performed a thorough irrigation with 3 L of saline.  I then performed a complex closure of this 18 cm laceration.  I then placed a sterile dressing she was placed in a knee immobilizer.  Then turned my attention to the right upper extremity we performed a closed manipulation and splinting of her ulnar styloid fracture her fifth metacarpal fracture and her P1 fracture of the small finger. She tolerated this well.  Next I turned my attention to the left upper extremity where I performed a closed manipulation and splinting of the left distal radius fracture and she tolerated this well.  She was then awoken and taken the PACU in stable condition  POST OPERATIVE PLAN: Weightbearing as tolerated to the right lower extremity in a knee immobilizer full time.  She'll come back to the OR on Thursday for ORIF of her left distal radius and fixation of her right hand fractures Aspirin for DVT prophylaxis    This note was generated using a template and dragon dictation system. In light of that, I have reviewed the note and all aspects of it are applicable to this case. Any dictation errors are due to the computerized dictation system.
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
## 2016-03-28 NOTE — Progress Notes (Signed)
Her pain is well-controlled.  The right lower extremity her dressing is clean dry and intact she is neurovascularly intact to her foot. Compartments are soft.  Bilateral upper extremities are in splints and neurovascularly intact.  Plan:   ORIF left distal radius and CRPP right fifth metacarpal on Thursday morning 4/27 WBAT in knee immobilizer RLE WBAT through elbows BUE  NWB B hand/wrist
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
## 2016-03-28 NOTE — Anesthesia Postprocedure Evaluation (Signed)
Anesthesia Post Note  Patient: Ana Moore  Procedure(s) Performed: Procedure(s) (LRB): IRRIGATION AND DEBRIDEMENT RIGHT KNEE, LEFT WRIST SPLINT, RIGHT ULNA/RADIUS SPLINT (Right)  Patient location during evaluation: PACU Level of consciousness: awake Pain management: pain level controlled Vital Signs Assessment: post-procedure vital signs reviewed and stable Respiratory status: spontaneous breathing Cardiovascular status: stable Anesthetic complications: no    Last Vitals:  Filed Vitals:   03/28/16 0015 03/28/16 0412  BP: 124/71 115/63  Pulse: 89 93  Temp: 37.1 C 37.2 C  Resp: 20 20    Last Pain:  Filed Vitals:   03/28/16 0550  PainSc: Asleep                 EDWARDS,Ardice Boyan

## 2016-03-29 LAB — CBC
HEMATOCRIT: 30.6 % — AB (ref 36.0–46.0)
HEMOGLOBIN: 9.4 g/dL — AB (ref 12.0–15.0)
MCH: 25.8 pg — ABNORMAL LOW (ref 26.0–34.0)
MCHC: 30.7 g/dL (ref 30.0–36.0)
MCV: 83.8 fL (ref 78.0–100.0)
Platelets: 241 10*3/uL (ref 150–400)
RBC: 3.65 MIL/uL — ABNORMAL LOW (ref 3.87–5.11)
RDW: 15.1 % (ref 11.5–15.5)
WBC: 11.9 10*3/uL — AB (ref 4.0–10.5)

## 2016-03-29 MED ORDER — WHITE PETROLATUM GEL
Status: AC
  Administered 2016-03-29: 0.2
  Filled 2016-03-29: qty 1

## 2016-03-29 MED ORDER — ACETAMINOPHEN 500 MG PO TABS: 1000.0000 mg | ORAL_TABLET | ORAL | Status: DC

## 2016-03-29 MED ORDER — CEFAZOLIN SODIUM-DEXTROSE 2-4 GM/100ML-% IV SOLN
2.0000 g | INTRAVENOUS | Status: AC
  Administered 2016-03-30: 2 g via INTRAVENOUS
  Filled 2016-03-29: qty 100
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
## 2016-03-29 NOTE — Progress Notes (Deleted)
OT Cancellation Note  Patient Details Name: Ana Moore MRN: [id number] DOB: 09-12-1986   Cancelled Treatment:    Reason Eval/Treat Not Completed: Other (comment) Pt scheduled for surgery tomorrow. Will assess after surgery to assist with D/C planning. Discussed with nsg need for BUE elevation, ice and frequently moving digits as able within splints. [place], OTR/L  [phone] 03/29/2016 03/29/2016, 1:38 PM

## 2016-03-29 NOTE — Progress Notes (Signed)
Patients mother Francesca Jewett called in wanting to know surgical time for tomorrow.  Mother and patient informed of 39 posted scheduled time.
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
## 2016-03-30 ENCOUNTER — Encounter (HOSPITAL_COMMUNITY): Admission: EM | Disposition: A | Discharge status: Home / Self Care | Payer: Self-pay | Source: Home / Self Care

## 2016-03-30 ENCOUNTER — Inpatient Hospital Stay (HOSPITAL_COMMUNITY): Payer: Self-pay | Admitting: Certified Registered Nurse Anesthetist

## 2016-03-30 ENCOUNTER — Inpatient Hospital Stay (HOSPITAL_COMMUNITY): Payer: Self-pay

## 2016-03-30 ENCOUNTER — Encounter (HOSPITAL_COMMUNITY): Payer: Self-pay | Admitting: Certified Registered Nurse Anesthetist

## 2016-03-30 HISTORY — PX: ORIF WRIST FRACTURE: SHX2133

## 2016-03-30 LAB — CBC
HCT: 30.3 % — ABNORMAL LOW (ref 36.0–46.0)
Hemoglobin: 9.8 g/dL — ABNORMAL LOW (ref 12.0–15.0)
MCH: 27.2 pg (ref 26.0–34.0)
MCHC: 32.3 g/dL (ref 30.0–36.0)
MCV: 84.2 fL (ref 78.0–100.0)
PLATELETS: 232 10*3/uL (ref 150–400)
RBC: 3.6 MIL/uL — AB (ref 3.87–5.11)
RDW: 14.7 % (ref 11.5–15.5)
WBC: 9.4 10*3/uL (ref 4.0–10.5)

## 2016-03-30 LAB — MRSA PCR SCREENING: MRSA by PCR: NEGATIVE

## 2016-03-30 SURGERY — OPEN REDUCTION INTERNAL FIXATION (ORIF) WRIST FRACTURE
Anesthesia: Regional | Site: Wrist | Laterality: Bilateral

## 2016-03-30 MED ORDER — LACTATED RINGERS IV SOLN: INTRAVENOUS | Status: DC

## 2016-03-30 MED ORDER — 0.9 % SODIUM CHLORIDE (POUR BTL) OPTIME
TOPICAL | Status: DC | PRN
  Administered 2016-03-30: 1000 mL

## 2016-03-30 MED ORDER — BUPIVACAINE HCL (PF) 0.5 % IJ SOLN
INTRAMUSCULAR | Status: AC
  Filled 2016-03-30: qty 30

## 2016-03-30 MED ORDER — LIDOCAINE 2% (20 MG/ML) 5 ML SYRINGE
INTRAMUSCULAR | Status: AC
  Filled 2016-03-30: qty 5

## 2016-03-30 MED ORDER — LACTATED RINGERS IV SOLN
INTRAVENOUS | Status: DC | PRN
  Administered 2016-03-30 (×2): via INTRAVENOUS

## 2016-03-30 MED ORDER — MIDAZOLAM HCL 2 MG/2ML IJ SOLN
INTRAMUSCULAR | Status: AC
  Filled 2016-03-30: qty 2

## 2016-03-30 MED ORDER — ARTIFICIAL TEARS OP OINT
TOPICAL_OINTMENT | OPHTHALMIC | Status: DC | PRN
  Administered 2016-03-30: 1 via OPHTHALMIC

## 2016-03-30 MED ORDER — METOPROLOL TARTARATE 1 MG/ML SYRINGE (5ML)
Status: DC | PRN
  Administered 2016-03-30 (×2): 1 mg via INTRAVENOUS

## 2016-03-30 MED ORDER — CHLORHEXIDINE GLUCONATE 4 % EX LIQD
60.0000 mL | Freq: Once | CUTANEOUS | Status: DC
  Filled 2016-03-30: qty 60

## 2016-03-30 MED ORDER — ROCURONIUM BROMIDE 100 MG/10ML IV SOLN
INTRAVENOUS | Status: DC | PRN
  Administered 2016-03-30: 40 mg via INTRAVENOUS

## 2016-03-30 MED ORDER — ONDANSETRON HCL 4 MG/2ML IJ SOLN
INTRAMUSCULAR | Status: DC | PRN
  Administered 2016-03-30 (×2): 4 mg via INTRAVENOUS

## 2016-03-30 MED ORDER — SODIUM CHLORIDE 0.9% FLUSH
10.0000 mL | INTRAVENOUS | Status: DC | PRN
  Administered 2016-03-31 – 2016-04-01 (×3): 10 mL
  Filled 2016-03-30 (×3): qty 40

## 2016-03-30 MED ORDER — CEFAZOLIN SODIUM-DEXTROSE 2-4 GM/100ML-% IV SOLN
2.0000 g | Freq: Four times a day (QID) | INTRAVENOUS | Status: AC
  Administered 2016-03-30 – 2016-03-31 (×3): 2 g via INTRAVENOUS
  Filled 2016-03-30 (×4): qty 100

## 2016-03-30 MED ORDER — PHENYLEPHRINE HCL 10 MG/ML IJ SOLN
INTRAMUSCULAR | Status: DC | PRN
  Administered 2016-03-30: 80 ug via INTRAVENOUS

## 2016-03-30 MED ORDER — GLYCOPYRROLATE 0.2 MG/ML IJ SOLN
INTRAMUSCULAR | Status: DC | PRN
  Administered 2016-03-30 (×2): 0.1 mg via INTRAVENOUS

## 2016-03-30 MED ORDER — SUGAMMADEX SODIUM 200 MG/2ML IV SOLN
INTRAVENOUS | Status: DC | PRN
  Administered 2016-03-30: 200 mg via INTRAVENOUS

## 2016-03-30 MED ORDER — MIDAZOLAM HCL 2 MG/2ML IJ SOLN
INTRAMUSCULAR | Status: DC | PRN
  Administered 2016-03-30: 2 mg via INTRAVENOUS

## 2016-03-30 MED ORDER — PROPOFOL 10 MG/ML IV BOLUS
INTRAVENOUS | Status: AC
  Filled 2016-03-30: qty 20

## 2016-03-30 MED ORDER — ROCURONIUM BROMIDE 50 MG/5ML IV SOLN
INTRAVENOUS | Status: AC
  Filled 2016-03-30: qty 1

## 2016-03-30 MED ORDER — FENTANYL CITRATE (PF) 100 MCG/2ML IJ SOLN
INTRAMUSCULAR | Status: AC
  Administered 2016-03-30: 25 ug
  Filled 2016-03-30: qty 2

## 2016-03-30 MED ORDER — POVIDONE-IODINE 10 % EX SWAB: 2.0000 "application " | Freq: Once | CUTANEOUS | Status: DC

## 2016-03-30 MED ORDER — SUGAMMADEX SODIUM 200 MG/2ML IV SOLN
INTRAVENOUS | Status: AC
  Filled 2016-03-30: qty 2

## 2016-03-30 MED ORDER — PHENYLEPHRINE 40 MCG/ML (10ML) SYRINGE FOR IV PUSH (FOR BLOOD PRESSURE SUPPORT)
PREFILLED_SYRINGE | INTRAVENOUS | Status: AC
  Filled 2016-03-30: qty 10

## 2016-03-30 MED ORDER — PROPOFOL 10 MG/ML IV BOLUS
INTRAVENOUS | Status: DC | PRN
  Administered 2016-03-30: 40 mg via INTRAVENOUS
  Administered 2016-03-30: 160 mg via INTRAVENOUS

## 2016-03-30 MED ORDER — FENTANYL CITRATE (PF) 250 MCG/5ML IJ SOLN
INTRAMUSCULAR | Status: DC | PRN
  Administered 2016-03-30: 25 ug via INTRAVENOUS
  Administered 2016-03-30 (×2): 100 ug via INTRAVENOUS
  Administered 2016-03-30: 25 ug via INTRAVENOUS

## 2016-03-30 MED ORDER — HYDROMORPHONE HCL 1 MG/ML IJ SOLN: 0.2500 mg | INTRAMUSCULAR | Status: DC | PRN

## 2016-03-30 MED ORDER — FENTANYL CITRATE (PF) 250 MCG/5ML IJ SOLN
INTRAMUSCULAR | Status: AC
  Filled 2016-03-30: qty 5

## 2016-03-30 MED ORDER — GLYCOPYRROLATE 0.2 MG/ML IV SOSY
PREFILLED_SYRINGE | INTRAVENOUS | Status: AC
  Filled 2016-03-30: qty 3

## 2016-03-30 MED ORDER — METOPROLOL TARTRATE 5 MG/5ML IV SOLN
INTRAVENOUS | Status: AC
  Filled 2016-03-30: qty 5

## 2016-03-30 MED ORDER — ONDANSETRON HCL 4 MG/2ML IJ SOLN
INTRAMUSCULAR | Status: AC
  Filled 2016-03-30: qty 2

## 2016-03-30 MED ORDER — LIDOCAINE HCL (CARDIAC) 20 MG/ML IV SOLN
INTRAVENOUS | Status: DC | PRN
  Administered 2016-03-30: 60 mg via INTRATRACHEAL

## 2016-03-30 MED ORDER — BUPIVACAINE HCL (PF) 0.5 % IJ SOLN
INTRAMUSCULAR | Status: DC | PRN
  Administered 2016-03-30: 30 mL

## 2016-03-30 MED ORDER — ARTIFICIAL TEARS OP OINT
TOPICAL_OINTMENT | OPHTHALMIC | Status: AC
  Filled 2016-03-30: qty 3.5

## 2016-03-30 SURGICAL SUPPLY — 76 items
BANDAGE ACE 3X5.8 VEL STRL LF (GAUZE/BANDAGES/DRESSINGS) ×3 IMPLANT
BANDAGE ELASTIC 3 VELCRO ST LF (GAUZE/BANDAGES/DRESSINGS) ×6 IMPLANT
BANDAGE ELASTIC 4 VELCRO ST LF (GAUZE/BANDAGES/DRESSINGS) ×3 IMPLANT
BENZOIN TINCTURE PRP APPL 2/3 (GAUZE/BANDAGES/DRESSINGS) IMPLANT
BIT DRILL 2.2 SS TIBIAL (BIT) ×3 IMPLANT
BLADE SURG ROTATE 9660 (MISCELLANEOUS) IMPLANT
BNDG COHESIVE 4X5 TAN STRL (GAUZE/BANDAGES/DRESSINGS) ×3 IMPLANT
BNDG ESMARK 4X9 LF (GAUZE/BANDAGES/DRESSINGS) ×3 IMPLANT
CLOSURE WOUND 1/2 X4 (GAUZE/BANDAGES/DRESSINGS)
CORDS BIPOLAR (ELECTRODE) ×3 IMPLANT
COVER SURGICAL LIGHT HANDLE (MISCELLANEOUS) ×3 IMPLANT
CUFF TOURNIQUET SINGLE 18IN (TOURNIQUET CUFF) ×3 IMPLANT
CUFF TOURNIQUET SINGLE 24IN (TOURNIQUET CUFF) IMPLANT
DECANTER SPIKE VIAL GLASS SM (MISCELLANEOUS) ×3 IMPLANT
DRAPE EXTREMITY T 121X128X90 (DRAPE) ×3 IMPLANT
DRAPE IMP U-DRAPE 54X76 (DRAPES) ×3 IMPLANT
DRAPE INCISE IOBAN 66X45 STRL (DRAPES) ×3 IMPLANT
DRAPE OEC MINIVIEW 54X84 (DRAPES) ×3 IMPLANT
DRAPE PROXIMA HALF (DRAPES) ×6 IMPLANT
DRAPE SURG 17X11 SM STRL (DRAPES) ×3 IMPLANT
DRAPE U-SHAPE 47X51 STRL (DRAPES) ×6 IMPLANT
ELECT CAUTERY BLADE 6.4 (BLADE) ×3 IMPLANT
ELECT REM PT RETURN 9FT ADLT (ELECTROSURGICAL) ×3
ELECTRODE REM PT RTRN 9FT ADLT (ELECTROSURGICAL) ×1 IMPLANT
GAUZE SPONGE 4X4 12PLY STRL (GAUZE/BANDAGES/DRESSINGS) IMPLANT
GAUZE XEROFORM 1X8 LF (GAUZE/BANDAGES/DRESSINGS) ×6 IMPLANT
GLOVE BIO SURGEON STRL SZ7 (GLOVE) ×3 IMPLANT
GLOVE BIO SURGEON STRL SZ7.5 (GLOVE) ×3 IMPLANT
GLOVE BIOGEL M 7.0 STRL (GLOVE) ×3 IMPLANT
GLOVE BIOGEL PI IND STRL 6.5 (GLOVE) ×3 IMPLANT
GLOVE BIOGEL PI IND STRL 7.0 (GLOVE) IMPLANT
GLOVE BIOGEL PI IND STRL 8 (GLOVE) ×2 IMPLANT
GLOVE BIOGEL PI INDICATOR 6.5 (GLOVE) ×6
GLOVE BIOGEL PI INDICATOR 7.0 (GLOVE)
GLOVE BIOGEL PI INDICATOR 8 (GLOVE) ×4
GLOVE SURG SS PI 6.5 STRL IVOR (GLOVE) ×6 IMPLANT
GOWN STRL REUS W/ TWL LRG LVL3 (GOWN DISPOSABLE) ×2 IMPLANT
GOWN STRL REUS W/ TWL XL LVL3 (GOWN DISPOSABLE) IMPLANT
GOWN STRL REUS W/TWL LRG LVL3 (GOWN DISPOSABLE) ×4
GOWN STRL REUS W/TWL XL LVL3 (GOWN DISPOSABLE)
K-WIRE 1.6 (WIRE) ×6
K-WIRE FX5X1.6XNS BN SS (WIRE) ×3
KIT BASIN OR (CUSTOM PROCEDURE TRAY) ×3 IMPLANT
KIT ROOM TURNOVER OR (KITS) ×3 IMPLANT
KWIRE FX5X1.6XNS BN SS (WIRE) ×3 IMPLANT
MANIFOLD NEPTUNE II (INSTRUMENTS) IMPLANT
NEEDLE 22X1 1/2 (OR ONLY) (NEEDLE) ×3 IMPLANT
NEEDLE HYPO 25GX1X1/2 BEV (NEEDLE) IMPLANT
NS IRRIG 1000ML POUR BTL (IV SOLUTION) ×3 IMPLANT
PACK ORTHO EXTREMITY (CUSTOM PROCEDURE TRAY) ×3 IMPLANT
PAD ARMBOARD 7.5X6 YLW CONV (MISCELLANEOUS) ×3 IMPLANT
PAD CAST 4YDX4 CTTN HI CHSV (CAST SUPPLIES) ×2 IMPLANT
PADDING CAST COTTON 4X4 STRL (CAST SUPPLIES) ×4
PEG LOCKING SMOOTH 2.2X18 (Peg) ×18 IMPLANT
PLATE NARROW DVR LEFT (Plate) ×3 IMPLANT
SCREW LOCK 12X2.7X 3 LD (Screw) ×1 IMPLANT
SCREW LOCKING 2.7X12MM (Screw) ×3 IMPLANT
SCREW LOCKING 2.7X13MM (Screw) ×3 IMPLANT
SCREW LOCKING 2.7X15MM (Screw) ×3 IMPLANT
SPONGE GAUZE 4X4 12PLY STER LF (GAUZE/BANDAGES/DRESSINGS) ×6 IMPLANT
SPONGE LAP 4X18 X RAY DECT (DISPOSABLE) ×3 IMPLANT
STRIP CLOSURE SKIN 1/2X4 (GAUZE/BANDAGES/DRESSINGS) IMPLANT
SUT ETHILON 3 0 PS 1 (SUTURE) ×6 IMPLANT
SUT ETHILON 4 0 PS 2 18 (SUTURE) ×3 IMPLANT
SUT MNCRL AB 4-0 PS2 18 (SUTURE) ×3 IMPLANT
SUT VIC AB 2-0 CT1 27 (SUTURE) ×2
SUT VIC AB 2-0 CT1 TAPERPNT 27 (SUTURE) ×1 IMPLANT
SYR CONTROL 10ML LL (SYRINGE) ×3 IMPLANT
TOWEL OR 17X24 6PK STRL BLUE (TOWEL DISPOSABLE) ×3 IMPLANT
TOWEL OR 17X26 10 PK STRL BLUE (TOWEL DISPOSABLE) ×3 IMPLANT
TOWEL OR NON WOVEN STRL DISP B (DISPOSABLE) ×3 IMPLANT
TUBE CONNECTING 12'X1/4 (SUCTIONS) ×1
TUBE CONNECTING 12X1/4 (SUCTIONS) ×2 IMPLANT
UNDERPAD 30X30 INCONTINENT (UNDERPADS AND DIAPERS) ×6 IMPLANT
WATER STERILE IRR 1000ML POUR (IV SOLUTION) IMPLANT
YANKAUER SUCT BULB TIP NO VENT (SUCTIONS) IMPLANT

## 2016-03-30 NOTE — Progress Notes (Signed)
PT Cancellation Note  Patient Details Name: Ana Moore MRN: [id number] DOB: [birth year]   Cancelled Treatment:    Reason Eval/Treat Not Completed: Patient at surgery, will continue to follow as appropriate.    Cassell Clement, PT, CSCS Pager [phone] Office [phone]  03/30/2016, 10:18 AM

## 2016-03-30 NOTE — Anesthesia Postprocedure Evaluation (Signed)
Anesthesia Post Note  Patient: Ana Moore  Procedure(s) Performed: Procedure(s) (LRB): OPEN REDUCTION INTERNAL FIXATION (ORIF) WRIST FRACTURE (Bilateral)  Patient location during evaluation: PACU Anesthesia Type: General Level of consciousness: awake, awake and alert and oriented Pain management: pain level controlled Vital Signs Assessment: post-procedure vital signs reviewed and stable Respiratory status: spontaneous breathing, nonlabored ventilation and respiratory function stable Cardiovascular status: blood pressure returned to baseline Anesthetic complications: no    Last Vitals:  Filed Vitals:   03/30/16 1415 03/30/16 1416  BP:  109/73  Pulse: 76 74  Temp: 37.1 C   Resp: 13 15    Last Pain:  Filed Vitals:   03/30/16 1442  PainSc: 0-No pain                 Elaria Osias COKER

## 2016-03-30 NOTE — Anesthesia Procedure Notes (Addendum)
Procedure Name: Intubation Date/Time: 03/30/2016 11:02 AM Performed by: Collier Bullock Pre-anesthesia Checklist: Patient identified, Emergency Drugs available, Suction available and Patient being monitored Patient Re-evaluated:Patient Re-evaluated prior to inductionOxygen Delivery Method: Circle system utilized Preoxygenation: Pre-oxygenation with 100% oxygen Intubation Type: IV induction Ventilation: Oral airway inserted - appropriate to patient size Laryngoscope Size: Mac and 3 Grade View: Grade I Tube type: Oral Tube size: 7.0 mm Number of attempts: 1 Airway Equipment and Method: Stylet Placement Confirmation: ETT inserted through vocal cords under direct vision,  positive ETCO2 and breath sounds checked- equal and bilateral Secured at: 22 cm Tube secured with: Tape Dental Injury: Teeth and Oropharynx as per pre-operative assessment    Anesthesia Regional Block:  Axillary brachial plexus block  Pre-Anesthetic Checklist: ,, timeout performed, Correct Patient, Correct Site, Correct Laterality, Correct Procedure, Correct Position, site marked, Risks and benefits discussed,  Surgical consent,  Pre-op evaluation,  At surgeon's request and post-op pain management  Laterality: Left  Prep: chloraprep       Needles:  Injection technique: Single-shot  Needle Type: Echogenic Stimulator Needle     Needle Length: 9cm 9 cm Needle Gauge: 21 and 21 G    Additional Needles: Axillary brachial plexus block Narrative:  Start time: 03/30/2016 12:25 PM End time: 03/30/2016 12:30 PM Injection made incrementally with aspirations every 5 mL.  Performed by: Personally   Additional Notes: 30 cc 0.5% Bupivacaine with 1:200 Epi injected easily   RIJ CVP Dual Lumen: 1015-1030: The patient was identified and consent obtained.  TO was performed, and full barrier precautions were used.  The skin was anesthetized with lidocaine.  Once the vein was located with the 22 ga. needle using  ultrasound guidance , the wire was inserted into the vein.  The wire location was confirmed with ultrasound.  The tissue was dilated and the catheter was carefully inserted, then sutured in place. A dressing was applied. The patient tolerated the procedure well. CE

## 2016-03-30 NOTE — H&P (View-Only) (Signed)
Her pain is well-controlled.  The right lower extremity her dressing is clean dry and intact she is neurovascularly intact to her foot. Compartments are soft.  Bilateral upper extremities are in splints and neurovascularly intact.  Plan:   ORIF left distal radius and CRPP right fifth metacarpal on Thursday morning 4/27 WBAT in knee immobilizer RLE WBAT through elbows BUE  NWB B hand/wrist

## 2016-03-30 NOTE — Transfer of Care (Signed)
Immediate Anesthesia Transfer of Care Note  Patient: Ana Moore  Procedure(s) Performed: Procedure(s): OPEN REDUCTION INTERNAL FIXATION (ORIF) WRIST FRACTURE (Bilateral)  Patient Location: PACU  Anesthesia Type:General and Regional  Level of Consciousness: awake, alert  and patient cooperative  Airway & Oxygen Therapy: Patient Spontanous Breathing and Patient connected to face mask oxygen  Post-op Assessment: Report given to RN, Post -op Vital signs reviewed and stable, Patient moving all extremities X 4 and Patient able to stick tongue midline  Post vital signs: Reviewed and stable  Last Vitals:  Filed Vitals:   03/30/16 0445 03/30/16 0913  BP: 133/70 106/70  Pulse: 93 76  Temp: 37.2 C 37.2 C  Resp: 18 18    Last Pain:  Filed Vitals:   03/30/16 0913  PainSc: 9       Patients Stated Pain Goal: 2 ([id number])  Complications: No apparent anesthesia complications
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
## 2016-03-30 NOTE — Progress Notes (Signed)
HHPT/OT recommended by therapists.  Unfortunately, pt does not qualify for home therapies as self pay patient with current diagnosis.  Will continue to follow.    Reinaldo Raddle, RN, BSN  Trauma/Neuro ICU Case Manager [phone]
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
## 2016-03-30 NOTE — Anesthesia Preprocedure Evaluation (Signed)
Anesthesia Evaluation  Patient identified by MRN, date of birth, ID band Patient awake    Reviewed: Allergy & Precautions, NPO status , Patient's Chart, lab work & pertinent test results  Airway Mallampati: II  TM Distance: >3 FB Neck ROM: Full    Dental  (+) Teeth Intact, Dental Advisory Given   Pulmonary    breath sounds clear to auscultation       Cardiovascular  Rhythm:Regular Rate:Normal     Neuro/Psych    GI/Hepatic   Endo/Other    Renal/GU      Musculoskeletal   Abdominal   Peds  Hematology   Anesthesia Other Findings   Reproductive/Obstetrics                             Anesthesia Physical Anesthesia Plan  ASA: II  Anesthesia Plan: General   Post-op Pain Management: GA combined w/ Regional for post-op pain   Induction: Intravenous  Airway Management Planned: Oral ETT  Additional Equipment: CVP  Intra-op Plan:   Post-operative Plan: Extubation in OR  Informed Consent: I have reviewed the patients History and Physical, chart, labs and discussed the procedure including the risks, benefits and alternatives for the proposed anesthesia with the patient or authorized representative who has indicated his/her understanding and acceptance.   Dental advisory given  Plan Discussed with: Anesthesiologist and CRNA  Anesthesia Plan Comments:         Anesthesia Quick Evaluation

## 2016-03-31 ENCOUNTER — Encounter (HOSPITAL_COMMUNITY): Payer: Self-pay | Admitting: General Practice

## 2016-03-31 DIAGNOSIS — S161XXA Strain of muscle, fascia and tendon at neck level, initial encounter: Secondary | ICD-10-CM | POA: Diagnosis present

## 2016-03-31 LAB — CBC
HCT: 28.7 % — ABNORMAL LOW (ref 36.0–46.0)
HEMOGLOBIN: 9 g/dL — AB (ref 12.0–15.0)
MCH: 26 pg (ref 26.0–34.0)
MCHC: 31.4 g/dL (ref 30.0–36.0)
MCV: 82.9 fL (ref 78.0–100.0)
Platelets: 242 10*3/uL (ref 150–400)
RBC: 3.46 MIL/uL — AB (ref 3.87–5.11)
RDW: 14.6 % (ref 11.5–15.5)
WBC: 11.8 10*3/uL — ABNORMAL HIGH (ref 4.0–10.5)

## 2016-03-31 MED ORDER — TRAMADOL HCL 50 MG PO TABS
100.0000 mg | ORAL_TABLET | Freq: Four times a day (QID) | ORAL | Status: DC
  Administered 2016-03-31 – 2016-04-01 (×6): 100 mg via ORAL
  Filled 2016-03-31 (×6): qty 2

## 2016-03-31 MED ORDER — METHOCARBAMOL 500 MG PO TABS
1000.0000 mg | ORAL_TABLET | Freq: Four times a day (QID) | ORAL | Status: DC | PRN
  Administered 2016-03-31 – 2016-04-01 (×3): 1000 mg via ORAL
  Filled 2016-03-31 (×4): qty 2

## 2016-03-31 MED ORDER — OXYCODONE HCL 5 MG PO TABS
10.0000 mg | ORAL_TABLET | ORAL | Status: DC | PRN
  Administered 2016-03-31 – 2016-04-01 (×3): 20 mg via ORAL
  Filled 2016-03-31 (×4): qty 4
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
## 2016-03-31 NOTE — Clinical Social Work Note (Signed)
Clinical Social Worker continuing to follow patient and family for support and discharge planning needs.  Per RNCM, patient has made arrangements with family to assist her with supervision and assistance upon discharge.  Clinical Social Worker will sign off for now as social work intervention is no longer needed. Please consult [place] again if new need arises.  Barbette Or, [place]

## 2016-03-31 NOTE — Progress Notes (Signed)
Orthopedic Tech Progress Note Patient Details:  Ana Moore 10-25-1986 [id number]  Ortho Devices Type of Ortho Device: Soft collar Ortho Device/Splint Interventions: Application   [place] Pink 03/31/2016, 9:13 AM

## 2016-04-01 MED ORDER — METHOCARBAMOL 500 MG PO TABS: 500.0000 mg | ORAL_TABLET | Freq: Four times a day (QID) | ORAL | Status: DC | PRN

## 2016-04-01 MED ORDER — NAPROXEN 500 MG PO TABS: 500.0000 mg | ORAL_TABLET | Freq: Two times a day (BID) | ORAL | Status: DC

## 2016-04-01 MED ORDER — OXYCODONE HCL 10 MG PO TABS: 10.0000 mg | ORAL_TABLET | ORAL | Status: DC | PRN

## 2016-04-01 NOTE — Progress Notes (Signed)
Discharge instructions and prescriptions provided to patient.  No questions at time of discharge.  Bedside commode and walker delivered to room prior to discharge.  Patient escorted via wheelchair.  All belongings with boyfriend at time of discharge.  Escorted via wheelchair by NT, boyfriend to drive patient home.  VSS and without pain complaints.  IJ removed by IV team prior to discharge.

## 2016-04-01 NOTE — Progress Notes (Addendum)
Orthopaedic Trauma Service (OTS)   Subjective: Patient reports pain as moderate and primarily in the neck.  Denies numbness or tingling in the digits bilaterally.   Objective: Temp:  [98.1 F (36.7 C)-98.6 F (37 C)] 98.1 F (36.7 C) (04/29 0456) Pulse Rate:  [86-101] 86 (04/29 0456) Resp:  [16] 16 (04/29 0456) BP: (122-144)/(69-80) 126/75 mmHg (04/29 0456) SpO2:  [96 %-100 %] 100 % (04/29 0456) Physical Exam R&LUEx  Splint in place; will trim to enable better MCP flexion  Sens  Ax/R/M/U intact  Mot   Ax/ R/ PIN/ M/ AIN/ U intact  Rad 2+  Brisk CR RLE Wound intact, clean, dry; excellent  Edema/ swelling controlled  Sens: DPN, SPN, TN intact  Motor: EHL, FHL, and lessor toe ext and flex all intact grossly  Brisk cap refill, warm to touch  Assessment/Plan: 2 Days Post-Op Procedure(s) (LRB): OPEN REDUCTION INTERNAL FIXATION (ORIF) WRIST FRACTURE (Bilateral)  And I&D open right knee wound 1. Active motion of digits on the left, maintain splints clean and dry 2. F/u Dr. Percell Miller as scheduled  Altamese Elwood, MD [facility], [facility] [phone] (p)
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
## 2016-04-01 NOTE — Progress Notes (Signed)
PT is recommending a 3-in-1 BSC and a bilateral platform RW. Met with pt at bedside. She plans to return home with the support of her boyfriend and sister. Discussed DME. Contacted Merry Proud at [facility] for DME referral.
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
## 2016-04-01 NOTE — Discharge Instructions (Signed)
Keep splints and leg dressing clean and dry until follow up  [place] to walk with knee immobilizer on  Cast or Splint Care Casts and splints support injured limbs and keep bones from moving while they heal. It is important to care for your cast or splint at home.  HOME CARE INSTRUCTIONS  Keep the cast or splint uncovered during the drying period. It can take 24 to 48 hours to dry if it is made of plaster. A fiberglass cast will dry in less than 1 hour.  Do not rest the cast on anything harder than a pillow for the first 24 hours.  Do not put weight on your injured limb or apply pressure to the cast until your health care provider gives you permission.  Keep the cast or splint dry. Wet casts or splints can lose their shape and may not support the limb as well. A wet cast that has lost its shape can also create harmful pressure on your skin when it dries. Also, wet skin can become infected.  Cover the cast or splint with a plastic bag when bathing or when out in the rain or snow. If the cast is on the trunk of the body, take sponge baths until the cast is removed.  If your cast does become wet, dry it with a towel or a blow dryer on the cool setting only.  Keep your cast or splint clean. Soiled casts may be wiped with a moistened cloth.  Do not place any hard or soft foreign objects under your cast or splint, such as cotton, toilet paper, lotion, or powder.  Do not try to scratch the skin under the cast with any object. The object could get stuck inside the cast. Also, scratching could lead to an infection. If itching is a problem, use a blow dryer on a cool setting to relieve discomfort.  Do not trim or cut your cast or remove padding from inside of it.  Exercise all joints next to the injury that are not immobilized by the cast or splint. For example, if you have a long leg cast, exercise the hip joint and toes. If you have an arm cast or splint, exercise the shoulder, elbow, thumb, and  fingers.  Elevate your injured arm or leg on 1 or 2 pillows for the first 1 to 3 days to decrease swelling and pain.It is best if you can comfortably elevate your cast so it is higher than your heart. SEEK MEDICAL CARE IF:   Your cast or splint cracks.  Your cast or splint is too tight or too loose.  You have unbearable itching inside the cast.  Your cast becomes wet or develops a soft spot or area.  You have a bad smell coming from inside your cast.  You get an object stuck under your cast.  Your skin around the cast becomes red or raw.  You have new pain or worsening pain after the cast has been applied. SEEK IMMEDIATE MEDICAL CARE IF:   You have fluid leaking through the cast.  You are unable to move your fingers or toes.  You have discolored (blue or white), cool, painful, or very swollen fingers or toes beyond the cast.  You have tingling or numbness around the injured area.  You have severe pain or pressure under the cast.  You have any difficulty with your breathing or have shortness of breath.  You have chest pain.   This information is not intended to replace advice  given to you by your health care provider. Make sure you discuss any questions you have with your health care provider.   Document Released: 11/17/2000 Document Revised: 09/10/2013 Document Reviewed: 05/29/2013 Elsevier Interactive Patient Education 2016 [place], Adult A laceration is a cut that goes through all of the layers of the skin and into the tissue that is right under the skin. Some lacerations heal on their own. Others need to be closed with stitches (sutures), staples, skin adhesive strips, or skin glue. Proper laceration care minimizes the risk of infection and helps the laceration to heal better. HOW TO CARE FOR YOUR LACERATION If sutures or staples were used:  Keep the wound clean and dry.  If you were given a bandage (dressing), you should change it at least  one time per day or as told by your health care provider. You should also change it if it becomes wet or dirty.  Keep the wound completely dry for the first 24 hours or as told by your health care provider. After that time, you may shower or bathe. However, make sure that the wound is not soaked in water until after the sutures or staples have been removed.  Clean the wound one time each day or as told by your health care provider:  Wash the wound with soap and water.  Rinse the wound with water to remove all soap.  Pat the wound dry with a clean towel. Do not rub the wound.  After cleaning the wound, apply a thin layer of antibiotic ointmentas told by your health care provider. This will help to prevent infection and keep the dressing from sticking to the wound.  Have the sutures or staples removed as told by your health care provider. If skin adhesive strips were used:  Keep the wound clean and dry.  If you were given a bandage (dressing), you should change it at least one time per day or as told by your health care provider. You should also change it if it becomes dirty or wet.  Do not get the skin adhesive strips wet. You may shower or bathe, but be careful to keep the wound dry.  If the wound gets wet, pat it dry with a clean towel. Do not rub the wound.  Skin adhesive strips fall off on their own. You may trim the strips as the wound heals. Do not remove skin adhesive strips that are still stuck to the wound. They will fall off in time. If skin glue was used:  Try to keep the wound dry, but you may briefly wet it in the shower or bath. Do not soak the wound in water, such as by swimming.  After you have showered or bathed, gently pat the wound dry with a clean towel. Do not rub the wound.  Do not do any activities that will make you sweat heavily until the skin glue has fallen off on its own.  Do not apply liquid, cream, or ointment medicine to the wound while the skin glue  is in place. Using those may loosen the film before the wound has healed.  If you were given a bandage (dressing), you should change it at least one time per day or as told by your health care provider. You should also change it if it becomes dirty or wet.  If a dressing is placed over the wound, be careful not to apply tape directly over the skin glue. Doing that may cause the  glue to be pulled off before the wound has healed.  Do not pick at the glue. The skin glue usually remains in place for 5-10 days, then it falls off of the skin. General Instructions  Take over-the-counter and prescription medicines only as told by your health care provider.  If you were prescribed an antibiotic medicine or ointment, take or apply it as told by your doctor. Do not stop using it even if your condition improves.  To help prevent scarring, make sure to cover your wound with sunscreen whenever you are outside after stitches are removed, after adhesive strips are removed, or when glue remains in place and the wound is healed. Make sure to wear a sunscreen of at least 30 SPF.  Do not scratch or pick at the wound.  Keep all follow-up visits as told by your health care provider. This is important.  Check your wound every day for signs of infection. Watch for:  Redness, swelling, or pain.  Fluid, blood, or pus.  Raise (elevate) the injured area above the level of your heart while you are sitting or lying down, if possible. SEEK MEDICAL CARE IF:  You received a tetanus shot and you have swelling, severe pain, redness, or bleeding at the injection site.  You have a fever.  A wound that was closed breaks open.  You notice a bad smell coming from your wound or your dressing.  You notice something coming out of the wound, such as wood or glass.  Your pain is not controlled with medicine.  You have increased redness, swelling, or pain at the site of your wound.  You have fluid, blood, or pus coming  from your wound.  You notice a change in the color of your skin near your wound.  You need to change the dressing frequently due to fluid, blood, or pus draining from the wound.  You develop a new rash.  You develop numbness around the wound. SEEK IMMEDIATE MEDICAL CARE IF:  You develop severe swelling around the wound.  Your pain suddenly increases and is severe.  You develop painful lumps near the wound or on skin that is anywhere on your body.  You have a red streak going away from your wound.  The wound is on your hand or foot and you cannot properly move a finger or toe.  The wound is on your hand or foot and you notice that your fingers or toes look pale or bluish.   This information is not intended to replace advice given to you by your health care provider. Make sure you discuss any questions you have with your health care provider.   Document Released: 11/20/2005 Document Revised: 04/06/2015 Document Reviewed: 11/16/2014 Elsevier Interactive Patient Education 2016 [employer]  Cervical Sprain A cervical sprain is an injury in the neck in which the strong, fibrous tissues (ligaments) that connect your neck bones stretch or tear. Cervical sprains can range from mild to severe. Severe cervical sprains can cause the neck vertebrae to be unstable. This can lead to damage of the spinal cord and can result in serious nervous system problems. The amount of time it takes for a cervical sprain to get better depends on the cause and extent of the injury. Most cervical sprains heal in 1 to 3 weeks. CAUSES  Severe cervical sprains may be caused by:   Contact sport injuries (such as from football, rugby, wrestling, hockey, auto racing, gymnastics, diving, martial arts, or boxing).   Motor vehicle  collisions.   Whiplash injuries. This is an injury from a sudden forward and backward whipping movement of the head and neck.  Falls.  Mild cervical sprains may be caused by:    Being in an awkward position, such as while cradling a telephone between your ear and shoulder.   Sitting in a chair that does not offer proper support.   Working at a poorly [employer] station.   Looking up or down for long periods of time.  SYMPTOMS   Pain, soreness, stiffness, or a burning sensation in the front, back, or sides of the neck. This discomfort may develop immediately after the injury or slowly, 24 hours or more after the injury.   Pain or tenderness directly in the middle of the back of the neck.   Shoulder or upper back pain.   Limited ability to move the neck.   Headache.   Dizziness.   Weakness, numbness, or tingling in the hands or arms.   Muscle spasms.   Difficulty swallowing or chewing.   Tenderness and swelling of the neck.  DIAGNOSIS  Most of the time your health care provider can diagnose a cervical sprain by taking your history and doing a physical exam. Your health care provider will ask about previous neck injuries and any known neck problems, such as arthritis in the neck. X-rays may be taken to find out if there are any other problems, such as with the bones of the neck. Other tests, such as a CT scan or MRI, may also be needed.  TREATMENT  Treatment depends on the severity of the cervical sprain. Mild sprains can be treated with rest, keeping the neck in place (immobilization), and pain medicines. Severe cervical sprains are immediately immobilized. Further treatment is done to help with pain, muscle spasms, and other symptoms and may include:  Medicines, such as pain relievers, numbing medicines, or muscle relaxants.   Physical therapy. This may involve stretching exercises, strengthening exercises, and posture training. Exercises and improved posture can help stabilize the neck, strengthen muscles, and help stop symptoms from returning.  HOME CARE INSTRUCTIONS   Put ice on the injured area.   Put ice in a plastic  bag.   Place a towel between your skin and the bag.   Leave the ice on for 15-20 minutes, 3-4 times a day.   If your injury was severe, you may have been given a cervical collar to wear. A cervical collar is a two-piece collar designed to keep your neck from moving while it heals.  Do not remove the collar unless instructed by your health care provider.  If you have long hair, keep it outside of the collar.  Ask your health care provider before making any adjustments to your collar. Minor adjustments may be required over time to improve comfort and reduce pressure on your chin or on the back of your head.  Ifyou are allowed to remove the collar for cleaning or bathing, follow your health care provider's instructions on how to do so safely.  Keep your collar clean by wiping it with mild soap and water and drying it completely. If the collar you have been given includes removable pads, remove them every 1-2 days and hand wash them with soap and water. Allow them to air dry. They should be completely dry before you wear them in the collar.  If you are allowed to remove the collar for cleaning and bathing, wash and dry the skin of your neck. Check  your skin for irritation or sores. If you see any, tell your health care provider.  Do not drive while wearing the collar.   Only take over-the-counter or prescription medicines for pain, discomfort, or fever as directed by your health care provider.   Keep all follow-up appointments as directed by your health care provider.   Keep all physical therapy appointments as directed by your health care provider.   Make any needed adjustments to your workstation to promote good posture.   Avoid positions and activities that make your symptoms worse.   Warm up and stretch before being active to help prevent problems.  SEEK MEDICAL CARE IF:   Your pain is not controlled with medicine.   You are unable to decrease your pain medicine over  time as planned.   Your activity level is not improving as expected.  SEEK IMMEDIATE MEDICAL CARE IF:   You develop any bleeding.  You develop stomach upset.  You have signs of an allergic reaction to your medicine.   Your symptoms get worse.   You develop new, unexplained symptoms.   You have numbness, tingling, weakness, or paralysis in any part of your body.  MAKE SURE YOU:   Understand these instructions.  Will watch your condition.  Will get help right away if you are not doing well or get worse.   This information is not intended to replace advice given to you by your health care provider. Make sure you discuss any questions you have with your health care provider.   Document Released: 09/17/2007 Document Revised: 11/25/2013 Document Reviewed: 05/28/2013 Elsevier Interactive Patient Education 2016 [place].    Managing Pain  Pain after surgery or related to activity is often due to strain/injury to muscle, tendon, nerves and/or incisions.  This pain is usually short-term and will improve in a few months.   Many people find it helpful to do the following things TOGETHER to help speed the process of healing and to get back to regular activity more quickly:  1. Avoid heavy physical activity at first a. No lifting greater than 20 pounds at first, then increase to lifting as tolerated over the next few weeks b. Do not push through the pain.  Listen to your body and avoid positions and maneuvers than reproduce the pain.  Wait a few days before trying something more intense c. Walking is okay as tolerated, but go slowly and stop when getting sore.  If you can walk 30 minutes without stopping or pain, you can try more intense activity (running, jogging, aerobics, cycling, swimming, treadmill, sex, sports, weightlifting, etc ) d. Remember: If it hurts to do it, then dont do it!  2. Take Anti-inflammatory medication a. Choose ONE of the following over-the-counter  medications: i.            Acetaminophen 500mg  tabs (Tylenol) 1-2 pills with every meal and just before bedtime (avoid if you have liver problems) ii.            Naproxen 220mg  tabs (ex. Aleve) 1-2 pills twice a day (avoid if you have kidney, stomach, IBD, or bleeding problems) iii. Ibuprofen 200mg  tabs (ex. Advil, Motrin) 3-4 pills with every meal and just before bedtime (avoid if you have kidney, stomach, IBD, or bleeding problems) b. Take with food/snack around the clock for 1-2 weeks i. This helps the muscle and nerve tissues become less irritable and calm down faster  3. Use a Heating pad or Ice/Cold Pack a. 4-6 times a  day b. May use warm bath/hottub  or showers  4. Try Gentle Massage and/or Stretching  a. at the area of pain many times a day b. stop if you feel pain - do not overdo it  Try these steps together to help you body heal faster and avoid making things get worse.  Doing just one of these things may not be enough.    If you are not getting better after two weeks or are noticing you are getting worse, contact our office for further advice; we may need to re-evaluate you & see what other things we can do to help.  GETTING TO GOOD BOWEL HEALTH. Irregular bowel habits such as constipation and diarrhea can lead to many problems over time.  Having one soft bowel movement a day is the most important way to prevent further problems.  The anorectal canal is designed to handle stretching and feces to safely manage our ability to get rid of solid waste (feces, poop, stool) out of our body.  BUT, hard constipated stools can act like ripping concrete bricks and diarrhea can be a burning fire to this very sensitive area of our body, causing inflamed hemorrhoids, anal fissures, increasing risk is perirectal abscesses, abdominal pain/bloating, an making irritable bowel worse.      The goal: ONE SOFT BOWEL MOVEMENT A DAY!  To have soft, regular bowel movements:   Drink plenty of fluids,  consider 4-6 tall glasses of water a day.    Take plenty of fiber.  Fiber is the undigested part of plant food that passes into the colon, acting s natures broom to encourage bowel motility and movement.  Fiber can absorb and hold large amounts of water. This results in a larger, bulkier stool, which is soft and easier to pass. Work gradually over several weeks up to 6 servings a day of fiber (25g a day even more if needed) in the form of: o Vegetables -- Root (potatoes, carrots, turnips), leafy green (lettuce, salad greens, celery, spinach), or cooked high residue (cabbage, broccoli, etc) o Fruit -- Fresh (unpeeled skin & pulp), Dried (prunes, apricots, cherries, etc ),  or stewed ( applesauce)  o Whole grain breads, pasta, etc (whole wheat)  o Bran cereals   Bulking Agents -- This type of water-retaining fiber generally is easily obtained each day by one of the following:  o Psyllium bran -- The psyllium plant is remarkable because its ground seeds can retain so much water. This product is available as Metamucil, Konsyl, Effersyllium, Per Diem Fiber, or the less expensive generic preparation in drug and health food stores. Although labeled a laxative, it really is not a laxative.  o Methylcellulose -- This is another fiber derived from wood which also retains water. It is available as Citrucel. o Polyethylene Glycol - and artificial fiber commonly called Miralax or Glycolax.  It is helpful for people with gassy or bloated feelings with regular fiber o Flax Seed - a less gassy fiber than psyllium  No reading or other relaxing activity while on the toilet. If bowel movements take longer than 5 minutes, you are too constipated  AVOID CONSTIPATION.  High fiber and water intake usually takes care of this.  Sometimes a laxative is needed to stimulate more frequent bowel movements, but   Laxatives are not a good long-term solution as it can wear the colon out.  They can help jump-start bowels if  constipated, but should be relied on constantly without discussing with your doctor o  Osmotics (Milk of Magnesia, Fleets phosphosoda, Magnesium citrate, MiraLax, GoLytely) are safer than  o Stimulants (Senokot, Castor Oil, Dulcolax, Ex Lax)    o Avoid taking laxatives for more than 7 days in a row.   IF SEVERELY CONSTIPATED, try a Bowel Retraining Program: o Do not use laxatives.  o Eat a diet high in roughage, such as bran cereals and leafy vegetables.  o Drink six (6) ounces of prune or apricot juice each morning.  o Eat two (2) large servings of stewed fruit each day.  o Take one (1) heaping tablespoon of a psyllium-based bulking agent twice a day. Use sugar-free sweetener when possible to avoid excessive calories.  o Eat a normal breakfast.  o Set aside 15 minutes after breakfast to sit on the toilet, but do not strain to have a bowel movement.  o If you do not have a bowel movement by the third day, use an enema and repeat the above steps.   Controlling diarrhea o Switch to liquids and simpler foods for a few days to avoid stressing your intestines further. o Avoid dairy products (especially milk & ice cream) for a short time.  The intestines often can lose the ability to digest lactose when stressed. o Avoid foods that cause gassiness or bloating.  Typical foods include beans and other legumes, cabbage, broccoli, and dairy foods.  Every person has some sensitivity to other foods, so listen to our body and avoid those foods that trigger problems for you. o Adding fiber (Citrucel, Metamucil, psyllium, Miralax) gradually can help thicken stools by absorbing excess fluid and retrain the intestines to act more normally.  Slowly increase the dose over a few weeks.  Too much fiber too soon can backfire and cause cramping & bloating. o Probiotics (such as active yogurt, Align, etc) may help repopulate the intestines and colon with normal bacteria and calm down a sensitive digestive tract.  Most  studies show it to be of mild help, though, and such products can be costly. o Medicines: - Bismuth subsalicylate (ex. Kayopectate, Pepto Bismol) every 30 minutes for up to 6 doses can help control diarrhea.  Avoid if pregnant. - Loperamide (Immodium) can slow down diarrhea.  Start with two tablets (4mg  total) first and then try one tablet every 6 hours.  Avoid if you are having fevers or severe pain.  If you are not better or start feeling worse, stop all medicines and call your doctor for advice o Call your doctor if you are getting worse or not better.  Sometimes further testing (cultures, endoscopy, X-ray studies, bloodwork, etc) may be needed to help diagnose and treat the cause of the diarrhea.  TROUBLESHOOTING IRREGULAR BOWELS 1) Avoid extremes of bowel movements (no bad constipation/diarrhea) 2) Miralax 17gm mixed in 8oz. water or juice-daily. May use BID as needed.  3) Gas-x,Phazyme, etc. as needed for gas & bloating.  4) Soft,bland diet. No spicy,greasy,fried foods.  5) Prilosec over-the-counter as needed  6) May hold gluten/wheat products from diet to see if symptoms improve.  7)  May try probiotics (Align, Activa, etc) to help calm the bowels down 7) If symptoms become worse call back immediately.
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
## 2016-04-03 ENCOUNTER — Encounter (HOSPITAL_COMMUNITY): Payer: Self-pay

## 2017-06-07 ENCOUNTER — Emergency Department (HOSPITAL_COMMUNITY): Payer: Worker's Compensation

## 2017-06-07 ENCOUNTER — Emergency Department (HOSPITAL_COMMUNITY)
Admission: EM | Admit: 2017-06-07 | Discharge: 2017-06-07 | Disposition: A | Discharge status: Home / Self Care | Payer: Worker's Compensation | Attending: Emergency Medicine | Admitting: Emergency Medicine

## 2017-06-07 ENCOUNTER — Encounter (HOSPITAL_COMMUNITY): Payer: Self-pay | Admitting: Emergency Medicine

## 2017-06-07 DIAGNOSIS — Y939 Activity, unspecified: Secondary | ICD-10-CM | POA: Insufficient documentation

## 2017-06-07 DIAGNOSIS — Y92410 Unspecified street and highway as the place of occurrence of the external cause: Secondary | ICD-10-CM | POA: Insufficient documentation

## 2017-06-07 DIAGNOSIS — Y998 Other external cause status: Secondary | ICD-10-CM | POA: Insufficient documentation

## 2017-06-07 DIAGNOSIS — M79602 Pain in left arm: Secondary | ICD-10-CM | POA: Insufficient documentation

## 2017-06-07 LAB — POC URINE PREG, ED: PREG TEST UR: NEGATIVE

## 2017-06-07 MED ORDER — ACETAMINOPHEN 325 MG PO TABS
650.0000 mg | ORAL_TABLET | Freq: Once | ORAL | Status: AC
  Administered 2017-06-07: 650 mg via ORAL
  Filled 2017-06-07: qty 2

## 2017-06-07 NOTE — ED Notes (Signed)
Patient transported to X-ray 
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
## 2017-06-07 NOTE — Discharge Instructions (Signed)
Your xrays do not show any sign of fractures. For pain control you may take:  800mg  of ibuprofen (that is usually 4 over the counter pills)  3 times a day (take with food) and acetaminophen 975mg  (this is 3 over the counter pills) four times a day. Do not drink alcohol or combine with other medications that have acetaminophen as an ingredient (Read the labels!).  He may feel worse in the coming days before starting to feel better. Please follow up at the wellness Center this week. The wellness Center sees patients leaving without insurance. If you develop worsening or new concerning symptoms you can return to the emergency department for re-evaluation.

## 2017-10-31 ENCOUNTER — Emergency Department (HOSPITAL_COMMUNITY)
Admission: EM | Admit: 2017-10-31 | Discharge: 2017-10-31 | Disposition: A | Discharge status: Home / Self Care | Payer: Self-pay | Attending: Emergency Medicine | Admitting: Emergency Medicine

## 2017-10-31 ENCOUNTER — Other Ambulatory Visit: Payer: Self-pay

## 2017-10-31 ENCOUNTER — Encounter (HOSPITAL_COMMUNITY): Payer: Self-pay | Admitting: *Deleted

## 2017-10-31 DIAGNOSIS — J029 Acute pharyngitis, unspecified: Secondary | ICD-10-CM | POA: Insufficient documentation

## 2017-10-31 DIAGNOSIS — H9209 Otalgia, unspecified ear: Secondary | ICD-10-CM | POA: Insufficient documentation

## 2017-10-31 LAB — RAPID STREP SCREEN (MED CTR MEBANE ONLY): STREPTOCOCCUS, GROUP A SCREEN (DIRECT): NEGATIVE

## 2017-10-31 MED ORDER — CETIRIZINE HCL 10 MG PO TABS: 10.0000 mg | ORAL_TABLET | Freq: Every day | ORAL | 1 refills | Status: DC

## 2017-10-31 NOTE — ED Triage Notes (Signed)
Patient presents stating she has been having a sore throat for 2 days and now both her ears are hurting.
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
## 2017-11-01 LAB — CULTURE, GROUP A STREP (THRC)

## 2017-11-02 ENCOUNTER — Other Ambulatory Visit: Payer: Self-pay

## 2017-11-02 ENCOUNTER — Inpatient Hospital Stay (HOSPITAL_COMMUNITY): Payer: Self-pay

## 2017-11-02 ENCOUNTER — Inpatient Hospital Stay (HOSPITAL_COMMUNITY)
Admission: AD | Admit: 2017-11-02 | Discharge: 2017-11-02 | Disposition: A | Discharge status: Home / Self Care | Payer: Self-pay | Source: Ambulatory Visit | Attending: Obstetrics & Gynecology | Admitting: Obstetrics & Gynecology

## 2017-11-02 ENCOUNTER — Encounter (HOSPITAL_COMMUNITY): Payer: Self-pay | Admitting: *Deleted

## 2017-11-02 ENCOUNTER — Other Ambulatory Visit: Payer: Self-pay | Admitting: Obstetrics and Gynecology

## 2017-11-02 ENCOUNTER — Telehealth: Payer: Self-pay | Admitting: *Deleted

## 2017-11-02 DIAGNOSIS — O209 Hemorrhage in early pregnancy, unspecified: Secondary | ICD-10-CM | POA: Insufficient documentation

## 2017-11-02 DIAGNOSIS — O99511 Diseases of the respiratory system complicating pregnancy, first trimester: Secondary | ICD-10-CM | POA: Insufficient documentation

## 2017-11-02 DIAGNOSIS — A599 Trichomoniasis, unspecified: Secondary | ICD-10-CM | POA: Insufficient documentation

## 2017-11-02 DIAGNOSIS — J020 Streptococcal pharyngitis: Secondary | ICD-10-CM | POA: Insufficient documentation

## 2017-11-02 DIAGNOSIS — O98811 Other maternal infectious and parasitic diseases complicating pregnancy, first trimester: Secondary | ICD-10-CM | POA: Insufficient documentation

## 2017-11-02 DIAGNOSIS — O3481 Maternal care for other abnormalities of pelvic organs, first trimester: Secondary | ICD-10-CM | POA: Insufficient documentation

## 2017-11-02 DIAGNOSIS — N83202 Unspecified ovarian cyst, left side: Secondary | ICD-10-CM | POA: Insufficient documentation

## 2017-11-02 DIAGNOSIS — Z3A01 Less than 8 weeks gestation of pregnancy: Secondary | ICD-10-CM | POA: Insufficient documentation

## 2017-11-02 DIAGNOSIS — O26851 Spotting complicating pregnancy, first trimester: Secondary | ICD-10-CM

## 2017-11-02 LAB — CBC
HCT: 37.7 % (ref 36.0–46.0)
Hemoglobin: 12 g/dL (ref 12.0–15.0)
MCH: 26.8 pg (ref 26.0–34.0)
MCHC: 31.8 g/dL (ref 30.0–36.0)
MCV: 84.3 fL (ref 78.0–100.0)
PLATELETS: 317 10*3/uL (ref 150–400)
RBC: 4.47 MIL/uL (ref 3.87–5.11)
RDW: 15.2 % (ref 11.5–15.5)
WBC: 9.7 10*3/uL (ref 4.0–10.5)

## 2017-11-02 LAB — WET PREP, GENITAL
Clue Cells Wet Prep HPF POC: NONE SEEN
SPERM: NONE SEEN
YEAST WET PREP: NONE SEEN

## 2017-11-02 LAB — URINALYSIS, ROUTINE W REFLEX MICROSCOPIC
Bilirubin Urine: NEGATIVE
Glucose, UA: NEGATIVE mg/dL
Ketones, ur: 5 mg/dL — AB
Nitrite: NEGATIVE
PH: 5 (ref 5.0–8.0)
Protein, ur: 30 mg/dL — AB
Specific Gravity, Urine: 1.03 (ref 1.005–1.030)

## 2017-11-02 LAB — HCG, QUANTITATIVE, PREGNANCY: hCG, Beta Chain, Quant, S: 12119 m[IU]/mL — ABNORMAL HIGH (ref <5)

## 2017-11-02 LAB — POCT PREGNANCY, URINE: Preg Test, Ur: POSITIVE — AB

## 2017-11-02 LAB — ABO/RH: ABO/RH(D): A POS

## 2017-11-02 MED ORDER — PENICILLIN V POTASSIUM 500 MG PO TABS: 500.0000 mg | ORAL_TABLET | Freq: Two times a day (BID) | ORAL | 0 refills | Status: AC

## 2017-11-02 MED ORDER — METRONIDAZOLE 500 MG PO TABS
2000.0000 mg | ORAL_TABLET | Freq: Once | ORAL | Status: AC
  Administered 2017-11-02: 2000 mg via ORAL
  Filled 2017-11-02: qty 4

## 2017-11-02 NOTE — MAU Provider Note (Signed)
History     CSN: [id number]  Arrival date and time: 11/02/17 8182   None     Chief Complaint  Patient presents with  . Spotting  . Possible Pregnancy   HPI   Ana Moore is a 31 y.o. female G1P0000 @ [redacted]w[redacted]d here in MAU with complaints of spotting that started on Nov 5th. She is unsure if she is pregnant. She is requesting a pregnancy test today. Certain LMP, No pain. Spotting is only noticed when she wipes. No active bleeding.   OB History    Gravida Para Term Preterm AB Living   0 0 0 0 0     SAB TAB Ectopic Multiple Live Births   0 0 0          Past Medical History:  Diagnosis Date  . Ovarian cyst   . Physiological ovarian cysts   . Thyroid enlarged     Past Surgical History:  Procedure Laterality Date  . I&D EXTREMITY Right 03/27/2016   Procedure: IRRIGATION AND DEBRIDEMENT RIGHT KNEE, LEFT WRIST SPLINT, RIGHT ULNA/RADIUS SPLINT;  Surgeon: Renette Butters, MD;  Location: [place];  Service: Orthopedics;  Laterality: Right;  aPPLICATION OF STERI-STRIPS TO LEFT ORBITAL LAC.  Marland Kitchen NO PAST SURGERIES    . ORIF WRIST FRACTURE Bilateral 03/30/2016  . ORIF WRIST FRACTURE Bilateral 03/30/2016   Procedure: OPEN REDUCTION INTERNAL FIXATION (ORIF) WRIST FRACTURE;  Surgeon: Renette Butters, MD;  Location: [place];  Service: Orthopedics;  Laterality: Bilateral;    No family history on file.  Social History   Tobacco Use  . Smoking status: Never Smoker  . Smokeless tobacco: Never Used  Substance Use Topics  . Alcohol use: Yes    Alcohol/week: 1.2 oz    Types: 2 Cans of beer per week    Comment: occasional  . Drug use: No    Allergies:  Allergies  Allergen Reactions  . Shellfish Allergy Itching and Swelling    Mouth itching and swelling  . Oxycodone Itching    Medications Prior to Admission  Medication Sig Dispense Refill Last Dose  . acetaminophen (TYLENOL) 500 MG tablet Take 1,000 mg by mouth every 6 (six) hours as needed for pain.   09/11/2014 at Unknown time   . cetirizine (ZYRTEC ALLERGY) 10 MG tablet Take 1 tablet (10 mg total) by mouth daily. 30 tablet 1   . HYDROcodone-acetaminophen (NORCO/VICODIN) 5-325 MG tablet Take 2 tablets by mouth every 4 (four) hours as needed. 6 tablet 0   . ibuprofen (ADVIL,MOTRIN) 400 MG tablet Take 1 tablet (400 mg total) by mouth every 6 (six) hours as needed. 30 tablet 0   . methocarbamol (ROBAXIN) 500 MG tablet Take 1-2 tablets (500-1,000 mg total) by mouth every 6 (six) hours as needed for muscle spasms. 30 tablet 1   . naproxen (NAPROSYN) 500 MG tablet Take 1 tablet (500 mg total) by mouth 2 (two) times daily with a meal. 20 tablet 1   . oxyCODONE 10 MG TABS Take 1-2 tablets (10-20 mg total) by mouth every 4 (four) hours as needed (10mg  for mild pain, 15mg  for moderate pain, 20mg  for severe pain). 30 tablet 0    Results for orders placed or performed during the hospital encounter of 11/02/17 (from the past 48 hour(s))  Wet prep, genital     Status: Abnormal   Collection Time: 11/02/17  8:00 AM  Result Value Ref Range   Yeast Wet Prep HPF POC NONE SEEN NONE SEEN  Trich, Wet Prep PRESENT (A) NONE SEEN   Clue Cells Wet Prep HPF POC NONE SEEN NONE SEEN   WBC, Wet Prep HPF POC FEW (A) NONE SEEN    Comment: MANY BACTERIA SEEN   Sperm NONE SEEN   Pregnancy, urine POC     Status: Abnormal   Collection Time: 11/02/17  8:26 AM  Result Value Ref Range   Preg Test, Ur POSITIVE (A) NEGATIVE    Comment:        THE SENSITIVITY OF THIS METHODOLOGY IS >24 mIU/mL   Urinalysis, Routine w reflex microscopic     Status: Abnormal   Collection Time: 11/02/17  8:45 AM  Result Value Ref Range   Color, Urine YELLOW YELLOW   APPearance HAZY (A) CLEAR   Specific Gravity, Urine 1.030 1.005 - 1.030   pH 5.0 5.0 - 8.0   Glucose, UA NEGATIVE NEGATIVE mg/dL   Hgb urine dipstick LARGE (A) NEGATIVE   Bilirubin Urine NEGATIVE NEGATIVE   Ketones, ur 5 (A) NEGATIVE mg/dL   Protein, ur 30 (A) NEGATIVE mg/dL   Nitrite NEGATIVE  NEGATIVE   Leukocytes, UA LARGE (A) NEGATIVE   RBC / HPF 6-30 0 - 5 RBC/hpf   WBC, UA TOO NUMEROUS TO COUNT 0 - 5 WBC/hpf   Bacteria, UA RARE (A) NONE SEEN   Squamous Epithelial / LPF 0-5 (A) NONE SEEN   Mucus PRESENT   CBC     Status: None   Collection Time: 11/02/17  9:11 AM  Result Value Ref Range   WBC 9.7 4.0 - 10.5 K/uL   RBC 4.47 3.87 - 5.11 MIL/uL   Hemoglobin 12.0 12.0 - 15.0 g/dL   HCT 37.7 36.0 - 46.0 %   MCV 84.3 78.0 - 100.0 fL   MCH 26.8 26.0 - 34.0 pg   MCHC 31.8 30.0 - 36.0 g/dL   RDW 15.2 11.5 - 15.5 %   Platelets 317 150 - 400 K/uL  ABO/Rh     Status: None   Collection Time: 11/02/17  9:11 AM  Result Value Ref Range   ABO/RH(D) A POS   hCG, quantitative, pregnancy     Status: Abnormal   Collection Time: 11/02/17  9:11 AM  Result Value Ref Range   hCG, Beta Chain, Quant, S 12,119 (H) <5 mIU/mL    Comment:          GEST. AGE      CONC.  (mIU/mL)   <=1 WEEK        5 - 50     2 WEEKS       50 - 500     3 WEEKS       100 - 10,000     4 WEEKS     1,000 - 30,000     5 WEEKS     3,500 - 115,000   6-8 WEEKS     12,000 - 270,000    12 WEEKS     15,000 - 220,000        FEMALE AND NON-PREGNANT FEMALE:     LESS THAN 5 mIU/mL     US Ob Comp Less 14 Wks  Result Date: 11/02/2017 CLINICAL DATA:  Vaginal bleeding EXAM: OBSTETRIC <14 WK [place] AND TRANSVAGINAL OB US TECHNIQUE: Both transabdominal and transvaginal ultrasound examinations were performed for complete evaluation of the gestation as well as the maternal uterus, adnexal regions, and pelvic cul-de-sac. Transvaginal technique was performed to assess early pregnancy. COMPARISON:  None. FINDINGS: Intrauterine gestational sac:  Single Yolk sac:  Visualized Embryo:  Question early embryo Cardiac Activity: Not visualized Heart Rate:   bpm MSD:   mm    w     d CRL:  2.8  mm   5 w   5 d                  [place] EDC: 06/30/2018 Subchorionic hemorrhage:  None visualized. Maternal uterus/adnexae: Endocervical canal appears thickened  and heterogeneous, possibly related to blood products. Large complex cyst in the left ovary measures 6.4 x 5.8 x 4.5 cm. This was noted on prior CT when this measured up to 9 cm in April 2017. Low level internal echoes noted. IMPRESSION: Probable early embryo, 5 weeks 5 days by crown-rump length. No cardiac activity detected currently. This could be followed with repeat ultrasound in 14 days to ensure expected progression. 6.4 cm complex left ovarian cystic mass compared to 9 cm on prior CT. This may reflect an endometrioma. Thickened, heterogeneous appearance of the endocervical endometrium, possibly related to blood products. Electronically Signed   By: Rolm Baptise M.D.   On: 11/02/2017 10:33   US Ob Transvaginal  Result Date: 11/02/2017 CLINICAL DATA:  Vaginal bleeding EXAM: OBSTETRIC <14 WK [place] AND TRANSVAGINAL OB US TECHNIQUE: Both transabdominal and transvaginal ultrasound examinations were performed for complete evaluation of the gestation as well as the maternal uterus, adnexal regions, and pelvic cul-de-sac. Transvaginal technique was performed to assess early pregnancy. COMPARISON:  None. FINDINGS: Intrauterine gestational sac: Single Yolk sac:  Visualized Embryo:  Question early embryo Cardiac Activity: Not visualized Heart Rate:   bpm MSD:   mm    w     d CRL:  2.8  mm   5 w   5 d                  [place] EDC: 06/30/2018 Subchorionic hemorrhage:  None visualized. Maternal uterus/adnexae: Endocervical canal appears thickened and heterogeneous, possibly related to blood products. Large complex cyst in the left ovary measures 6.4 x 5.8 x 4.5 cm. This was noted on prior CT when this measured up to 9 cm in April 2017. Low level internal echoes noted. IMPRESSION: Probable early embryo, 5 weeks 5 days by crown-rump length. No cardiac activity detected currently. This could be followed with repeat ultrasound in 14 days to ensure expected progression. 6.4 cm complex left ovarian cystic mass compared to 9 cm on  prior CT. This may reflect an endometrioma. Thickened, heterogeneous appearance of the endocervical endometrium, possibly related to blood products. Electronically Signed   By: Rolm Baptise M.D.   On: 11/02/2017 10:33   Review of Systems  Constitutional: Negative for fever.  HENT: Positive for sore throat.   Gastrointestinal: Negative for abdominal pain.  Genitourinary: Positive for vaginal bleeding and vaginal discharge. Negative for dysuria.   Physical Exam   Blood pressure 127/76, pulse 98, temperature 98.5 F (36.9 C), temperature source Oral, resp. rate 18, height 5\' 4"  (1.626 m), weight 164 lb 12 oz (74.7 kg), last menstrual period 09/12/2017, SpO2 100 %.  Physical Exam  Constitutional: She is oriented to person, place, and time. She appears well-developed and well-nourished. No distress.  HENT:  Head: Normocephalic.  Eyes: Pupils are equal, round, and reactive to light.  GI: Soft. She exhibits no distension and no mass. There is no tenderness. There is no rebound and no guarding.  Genitourinary:  Genitourinary Comments: Wet prep and GC collected without speculum by RN  Musculoskeletal:  Normal range of motion.  Neurological: She is alert and oriented to person, place, and time.  Skin: Skin is warm. She is not diaphoretic.  Psychiatric: Her behavior is normal.   MAU Course  Procedures  None  MDM  Urine pregnancy test positive  Wet prep & GC HIV, CBC, Hcg, ABO US OB transvaginal  A positive blood type  Wet prep shows trichomonas- Flagyl 2 grams given PO  Discussed [place] in detail with the patient; size<dating with certain LMP.  Patient was seen in the ER this week for a sore throat, culture came back positive for Strep A, will give Rx for treatment today.   Assessment and Plan   A:  1. Vaginal bleeding in pregnancy, first trimester   2. Cyst of left ovary   3. Strep pharyngitis   4. Trichomonas infection     P:  Discharge home in stable condition Bleeding  precautions Pelvic rest Partner needs treatment Rx: Amoxicillin  Return to MAU if symptoms worsen   Rasch, Artist Pais, NP 11/02/2017 5:08 PM

## 2017-11-02 NOTE — Discharge Instructions (Signed)
Pelvic Rest Pelvic rest may be recommended if:  Your placenta is partially or completely covering the opening of your cervix (placenta previa).  There is bleeding between the wall of the uterus and the amniotic sac in the first trimester of pregnancy (subchorionic hemorrhage).  You went into labor too early (preterm labor).  Based on your overall health and the health of your baby, your health care provider will decide if pelvic rest is right for you. How do I rest my pelvis? For as long as told by your health care provider:  Do not have sex, sexual stimulation, or an orgasm.  Do not use tampons. Do not douche. Do not put anything in your vagina.  Do not lift anything that is heavier than 10 lb (4.5 kg).  Avoid activities that take a lot of effort (are strenuous).  Avoid any activity in which your pelvic muscles could become strained.  When should I seek medical care? Seek medical care if you have:  Cramping pain in your lower abdomen.  Vaginal discharge.  A low, dull backache.  Regular contractions.  Uterine tightening.  When should I seek immediate medical care? Seek immediate medical care if:  You have vaginal bleeding and you are pregnant.  This information is not intended to replace advice given to you by your health care provider. Make sure you discuss any questions you have with your health care provider. Document Released: 03/17/2011 Document Revised: 04/27/2016 Document Reviewed: 05/24/2015 Elsevier Interactive Patient Education  2018 [employer]. Trichomonas Test Why am I having this test? The trichomonas test is done to diagnose trichomoniasis, an infection caused by an organism called Trichomonas. Trichomoniasis is a sexually transmitted infection (STI). In women, it causes vaginal infections. In men, it can cause the tube that carries urine (urethra) to become inflamed (urethritis). You may have this test as a part of a routine screening for STIs or if  you have symptoms of trichomoniasis. To perform the test, your health care provider will take a sample of discharge. The sample is taken from the vagina or cervix in women and from the urethra in men. A urine sample can also be used for testing. What do the results mean? It is your responsibility to obtain your test results. Ask the lab or department performing the test when and how you will get your results. Contact your health care provider to discuss any questions you have about your results. Negative test results A negative test means you do not have trichomoniasis. Follow your health care provider's directions about any follow-up testing. Positive test results A positive test result means you have an active infection that needs to be treated with antibiotic medicine. All your current sexual partners must also be treated or it is likely you will get reinfected. If your test is positive, your health care provider will start you on medicine and may advise you to:  Not have sexual intercourse until your infection has cleared up.  Use a latex condom properly every time you have sexual intercourse.  Limit the number of sexual partners you have. The more partners you have, the greater your risk of contracting trichomoniasis or another STI.  Tell all sexual partners about your infection so they can also be treated and to prevent reinfection.  Talk with your health care provider to discuss your results, treatment options, and if necessary, the need for more tests. Talk with your health care provider if you have any questions about your results. This information is not  intended to replace advice given to you by your health care provider. Make sure you discuss any questions you have with your health care provider. Document Released: 12/23/2004 Document Revised: 06/21/2016 Document Reviewed: 12/02/2013 Elsevier Interactive Patient Education  2017 [employer].    .ob

## 2017-11-02 NOTE — Progress Notes (Signed)
ED Antimicrobial Stewardship Positive Culture Follow Up   Ana Moore is an 31 y.o. female who presented to [facility] on 10/31/2017 with a chief complaint of  Chief Complaint  Patient presents with  . Sore Throat  . Otalgia    Recent Results (from the past 720 hour(s))  Rapid strep screen     Status: None   Collection Time: 10/31/17  4:00 AM  Result Value Ref Range Status   Streptococcus, Group A Screen (Direct) NEGATIVE NEGATIVE Final    Comment: (NOTE) A Rapid Antigen test may result negative if the antigen level in the sample is below the detection level of this test. The FDA has not cleared this test as a stand-alone test therefore the rapid antigen negative result has reflexed to a Group A Strep culture.   Culture, group A strep     Status: None   Collection Time: 10/31/17  4:00 AM  Result Value Ref Range Status   Specimen Description THROAT  Final   Special Requests NONE Reflexed from [id number]  Final   Culture FEW GROUP A STREP (S.PYOGENES) ISOLATED  Final   Report Status 11/01/2017 FINAL  Final  Wet prep, genital     Status: Abnormal   Collection Time: 11/02/17  8:00 AM  Result Value Ref Range Status   Yeast Wet Prep HPF POC NONE SEEN NONE SEEN Final   Trich, Wet Prep PRESENT (A) NONE SEEN Final   Clue Cells Wet Prep HPF POC NONE SEEN NONE SEEN Final   WBC, Wet Prep HPF POC FEW (A) NONE SEEN Final    Comment: MANY BACTERIA SEEN   Sperm NONE SEEN  Final    [x]  Patient discharged originally without antimicrobial agent and treatment is now indicated  31 yo PREGNANT female with sore throat and otalgia. No fever, N/V/D.   New antibiotic prescription: Amoxicillin 500 mg twice daily for 10 days  ED Provider: Benedetto Goad, PA-C   Ana Moore 11/02/2017, 9:25 AM Infectious Diseases Pharmacist Phone# [phone]

## 2017-11-02 NOTE — MAU Note (Signed)
Pt presents with c/o spotting since November 5th.  Reports normal LMP September 12, 2017.  Pt states she took home UPT, unsure of results.  Requesting UPT.

## 2017-11-02 NOTE — MAU Note (Signed)
Pt presents to MAU c/o spotting since Nov. 5, with increased discharge.  Pt took a HPT today and is unsure if she is pregnant or not.

## 2017-11-03 LAB — RPR: RPR Ser Ql: NONREACTIVE

## 2017-11-03 LAB — HIV ANTIBODY (ROUTINE TESTING W REFLEX): HIV Screen 4th Generation wRfx: NONREACTIVE

## 2017-11-03 LAB — CULTURE, OB URINE
CULTURE: NO GROWTH
Special Requests: NORMAL

## 2017-11-05 LAB — GC/CHLAMYDIA PROBE AMP (~~LOC~~) NOT AT ARMC
Chlamydia: NEGATIVE
NEISSERIA GONORRHEA: NEGATIVE

## 2017-11-22 ENCOUNTER — Other Ambulatory Visit: Payer: Self-pay

## 2017-11-22 ENCOUNTER — Encounter (HOSPITAL_COMMUNITY): Payer: Self-pay

## 2017-11-22 ENCOUNTER — Inpatient Hospital Stay (HOSPITAL_COMMUNITY): Payer: Self-pay

## 2017-11-22 ENCOUNTER — Inpatient Hospital Stay (HOSPITAL_COMMUNITY)
Admission: AD | Admit: 2017-11-22 | Discharge: 2017-11-22 | Disposition: A | Discharge status: Home / Self Care | Payer: Self-pay | Source: Ambulatory Visit | Attending: Family Medicine | Admitting: Family Medicine

## 2017-11-22 DIAGNOSIS — O98811 Other maternal infectious and parasitic diseases complicating pregnancy, first trimester: Secondary | ICD-10-CM | POA: Insufficient documentation

## 2017-11-22 DIAGNOSIS — Z3A08 8 weeks gestation of pregnancy: Secondary | ICD-10-CM | POA: Insufficient documentation

## 2017-11-22 DIAGNOSIS — Z885 Allergy status to narcotic agent status: Secondary | ICD-10-CM | POA: Insufficient documentation

## 2017-11-22 DIAGNOSIS — O3481 Maternal care for other abnormalities of pelvic organs, first trimester: Secondary | ICD-10-CM | POA: Insufficient documentation

## 2017-11-22 DIAGNOSIS — B3731 Acute candidiasis of vulva and vagina: Secondary | ICD-10-CM

## 2017-11-22 DIAGNOSIS — O209 Hemorrhage in early pregnancy, unspecified: Secondary | ICD-10-CM | POA: Insufficient documentation

## 2017-11-22 DIAGNOSIS — N888 Other specified noninflammatory disorders of cervix uteri: Secondary | ICD-10-CM

## 2017-11-22 DIAGNOSIS — Z91013 Allergy to seafood: Secondary | ICD-10-CM | POA: Insufficient documentation

## 2017-11-22 DIAGNOSIS — N83292 Other ovarian cyst, left side: Secondary | ICD-10-CM | POA: Insufficient documentation

## 2017-11-22 DIAGNOSIS — N889 Noninflammatory disorder of cervix uteri, unspecified: Secondary | ICD-10-CM | POA: Insufficient documentation

## 2017-11-22 DIAGNOSIS — B373 Candidiasis of vulva and vagina: Secondary | ICD-10-CM | POA: Insufficient documentation

## 2017-11-22 DIAGNOSIS — Z3491 Encounter for supervision of normal pregnancy, unspecified, first trimester: Secondary | ICD-10-CM

## 2017-11-22 LAB — CBC
HCT: 36.8 % (ref 36.0–46.0)
Hemoglobin: 12.2 g/dL (ref 12.0–15.0)
MCH: 26.6 pg (ref 26.0–34.0)
MCHC: 33.2 g/dL (ref 30.0–36.0)
MCV: 80.3 fL (ref 78.0–100.0)
PLATELETS: 315 10*3/uL (ref 150–400)
RBC: 4.58 MIL/uL (ref 3.87–5.11)
RDW: 14.3 % (ref 11.5–15.5)
WBC: 13.8 10*3/uL — ABNORMAL HIGH (ref 4.0–10.5)

## 2017-11-22 LAB — URINALYSIS, ROUTINE W REFLEX MICROSCOPIC
BACTERIA UA: NONE SEEN
Bilirubin Urine: NEGATIVE
Glucose, UA: NEGATIVE mg/dL
Ketones, ur: NEGATIVE mg/dL
Nitrite: NEGATIVE
PROTEIN: 30 mg/dL — AB
Specific Gravity, Urine: 1.027 (ref 1.005–1.030)
pH: 5 (ref 5.0–8.0)

## 2017-11-22 LAB — WET PREP, GENITAL
CLUE CELLS WET PREP: NONE SEEN
Sperm: NONE SEEN
Trich, Wet Prep: NONE SEEN

## 2017-11-22 MED ORDER — TERCONAZOLE 0.4 % VA CREA: 1.0000 | TOPICAL_CREAM | Freq: Every day | VAGINAL | 0 refills | Status: DC
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
## 2017-11-22 NOTE — MAU Note (Addendum)
Yesterday; saw a blood clot after she used a the bathroom.  Was seen in Nov, for spotting and discharging. Was told the baby wasn't growing at the right rate, large cyst on left ovary, but no follow up was discussed. Has continued to spot and have d/c since then

## 2017-11-23 LAB — GC/CHLAMYDIA PROBE AMP (~~LOC~~) NOT AT ARMC
Chlamydia: NEGATIVE
Neisseria Gonorrhea: NEGATIVE

## 2017-11-24 ENCOUNTER — Inpatient Hospital Stay (HOSPITAL_COMMUNITY)
Admission: AD | Admit: 2017-11-24 | Discharge: 2017-11-24 | Disposition: A | Discharge status: Home / Self Care | Payer: Self-pay | Source: Ambulatory Visit | Attending: Obstetrics and Gynecology | Admitting: Obstetrics and Gynecology

## 2017-11-24 ENCOUNTER — Other Ambulatory Visit: Payer: Self-pay

## 2017-11-24 ENCOUNTER — Encounter (HOSPITAL_COMMUNITY): Payer: Self-pay

## 2017-11-24 ENCOUNTER — Inpatient Hospital Stay (HOSPITAL_COMMUNITY): Payer: Self-pay

## 2017-11-24 DIAGNOSIS — N841 Polyp of cervix uteri: Secondary | ICD-10-CM | POA: Insufficient documentation

## 2017-11-24 DIAGNOSIS — Z3A08 8 weeks gestation of pregnancy: Secondary | ICD-10-CM | POA: Insufficient documentation

## 2017-11-24 DIAGNOSIS — O209 Hemorrhage in early pregnancy, unspecified: Secondary | ICD-10-CM | POA: Insufficient documentation

## 2017-11-24 DIAGNOSIS — O3441 Maternal care for other abnormalities of cervix, first trimester: Secondary | ICD-10-CM | POA: Insufficient documentation

## 2017-11-24 LAB — CULTURE, OB URINE

## 2017-11-24 NOTE — Progress Notes (Addendum)
G1 early pregn about [redacted] wksga. Was seen seen on Wednesday for the bleeding. Was told to come back if bleeding increases. Pt also stating she has been bleeding for past 2 months "since beginning of Nov".   Wedn had transv U/S. Provider informed to pt that everything was good.   Noted clots in the pad placed under patient when pt got into room .   2200: Provider at bs assessing and informal U/S done. Ordered for formal U/s  2208: U/S paged.   2217: pt to U/S via ambulatory.   2239: back from U/s  2324: provider at bs reassessing and discussing POC with discharge instructions.   2330: D/C instructions given with pt understanding. Pt left unit via ambulatory with her aunt.

## 2017-11-24 NOTE — MAU Note (Signed)
Has had bleeding few days but heavier today. Some abd pain

## 2017-11-24 NOTE — MAU Provider Note (Signed)
History     CSN: [id number]  Arrival date and time: 11/24/17 2107   First Provider Initiated Contact with Patient 11/24/17 2153      Chief Complaint  Patient presents with  . Vaginal Bleeding   Ana Moore is a 31 y.o. G1P0000 at [redacted]w[redacted]d who presents today with vaginal bleeding. She was seen on 12/20 with vaginal bleeding and ? Cervical polyp. She has a documented IUP with cardiac activity by [place] on 11/22/17. She states that she bleeding started on 12/19. She states that it has continued to get worse since her last visit.   Vaginal Bleeding  The patient's primary symptoms include pelvic pain and vaginal bleeding. This is a new problem. The current episode started in the past 7 days. The problem occurs constantly. The problem has been gradually worsening. Pain severity now: 9/10. The problem affects both sides. She is pregnant. The vaginal discharge was bloody. The vaginal bleeding is heavier than menses. She has been passing clots. She has not been passing tissue. Nothing aggravates the symptoms. She has tried nothing for the symptoms. Menstrual history: LMP 09/12/17.    Past Medical History:  Diagnosis Date  . Ovarian cyst   . Physiological ovarian cysts   . Thyroid enlarged     Past Surgical History:  Procedure Laterality Date  . I&D EXTREMITY Right 03/27/2016   Procedure: IRRIGATION AND DEBRIDEMENT RIGHT KNEE, LEFT WRIST SPLINT, RIGHT ULNA/RADIUS SPLINT;  Surgeon: Renette Butters, MD;  Location: [facility];  Service: Orthopedics;  Laterality: Right;  aPPLICATION OF STERI-STRIPS TO LEFT ORBITAL LAC.  Marland Kitchen NO PAST SURGERIES    . ORIF WRIST FRACTURE Bilateral 03/30/2016  . ORIF WRIST FRACTURE Bilateral 03/30/2016   Procedure: OPEN REDUCTION INTERNAL FIXATION (ORIF) WRIST FRACTURE;  Surgeon: Renette Butters, MD;  Location: [facility];  Service: Orthopedics;  Laterality: Bilateral;    No family history on file.  Social History   Tobacco Use  . Smoking status: Never Smoker  . Smokeless  tobacco: Never Used  Substance Use Topics  . Alcohol use: Yes    Alcohol/week: 1.2 oz    Types: 2 Cans of beer per week    Comment: occasional  . Drug use: No    Allergies:  Allergies  Allergen Reactions  . Shellfish Allergy Itching and Swelling    Mouth itching and swelling  . Oxycodone Itching    Medications Prior to Admission  Medication Sig Dispense Refill Last Dose  . acetaminophen (TYLENOL) 500 MG tablet Take 1,000 mg by mouth every 6 (six) hours as needed for pain.   11/21/2017 at Unknown time  . terconazole (TERAZOL 7) 0.4 % vaginal cream Place 1 applicator vaginally at bedtime. 45 g 0     Review of Systems  Genitourinary: Positive for pelvic pain and vaginal bleeding.   Physical Exam   Blood pressure 134/90, pulse (!) 117, temperature 98.5 F (36.9 C), resp. rate 18, height 5\' 4"  (1.626 m), weight 156 lb (70.8 kg), last menstrual period 09/12/2017, SpO2 100 %.  Physical Exam  Nursing note and vitals reviewed. Constitutional: She is oriented to person, place, and time. She appears well-developed and well-nourished. No distress.  HENT:  Head: Normocephalic.  Cardiovascular: Normal rate.  Respiratory: Effort normal.  GI: Soft. There is no tenderness. There is no rebound.  Genitourinary:  Genitourinary Comments: One soaked pad since being here with 2 grape sized clots. Pelvic exam deferred today, patient had an exam 2 days ago with cervical polyp noted.  Neurological: She is alert and oriented to person, place, and time.  Skin: Skin is warm and dry.  Psychiatric: She has a normal mood and affect.    US Ob Transvaginal  Result Date: 11/24/2017 CLINICAL DATA:  Acute onset of vaginal bleeding. EXAM: OBSTETRIC <14 WK ULTRASOUND TECHNIQUE: Transabdominal ultrasound was performed for evaluation of the gestation as well as the maternal uterus and adnexal regions. COMPARISON:  Pelvic ultrasound performed 11/22/2017, and CT of the abdomen and pelvis performed 03/27/2016  FINDINGS: Intrauterine gestational sac: Single; visualized and normal in shape. Yolk sac:  Yes Embryo:  Yes Cardiac Activity: Yes Heart Rate: 174 bpm CRL:  2.0 cm   8 w 3 d                  [place] EDC: 07/03/2018 Subchorionic hemorrhage:  None visualized. Maternal uterus/adnexae: The uterus is otherwise unremarkable in appearance. The right ovary is unremarkable, measuring 3.5 x 2.0 x 1.6 cm. A large mildly complex 5.6 x 5.6 x 5.3 cm cyst is noted at the left ovary, suspicious for a hemorrhagic cyst. Underlying left ovarian tissue is grossly unremarkable, though not fully assessed. No free fluid is seen within the pelvic cul-de-sac. IMPRESSION: 1. Single live intrauterine pregnancy noted, with a crown-rump length of 2.0 cm, corresponding to a gestational age of [redacted] weeks 3 days. This matches the gestational age of [redacted] weeks 4 days by the first ultrasound, reflecting an estimated date of delivery of July 02, 2018. 2. 5.6 cm mildly complex left ovarian cyst is suspicious for a hemorrhagic cyst. Electronically Signed   By: Garald Balding M.D.   On: 11/24/2017 23:16   MAU Course  Procedures  MDM   Assessment and Plan   1. Vaginal bleeding in pregnancy, first trimester   2. Cervical polyp    DC home Comfort measures reviewed  1st Trimester precautions  Bleeding precautions RX: none  Return to MAU as needed FU with OB as planned  Follow-up Information    Department, [facility] Follow up.   Contact information: [place] [address] [phone]            Marcille Buffy 11/24/2017, 9:58 PM

## 2017-11-24 NOTE — Discharge Instructions (Signed)
Prenatal Care Providers [facility]  [phone]     Phone: [place]                      [facility]  @[email or web address]     Phone: [phone]  Phone: [place]     Phone: [phone]  Phone: [place] [facility] @ [place]                hone: [phone]  Phone: [phone]         [facility] [address]      Phone: [phone]  Phone: [phone]         [place] Dept.                Phone: [phone]  [place])          Phone: [phone] [facility]   Phone: [phone] Safe Medications in Pregnancy   Acne: Benzoyl Peroxide Salicylic Acid  Backache/Headache: Tylenol: 2 regular strength every 4 hours OR              2 Extra strength every 6 hours  Colds/Coughs/Allergies: Benadryl (alcohol free) 25 mg every 6 hours as needed Breath right strips Claritin Cepacol throat lozenges Chloraseptic throat spray Cold-Eeze- up to three times per day Cough drops, alcohol free Flonase (by prescription only) Guaifenesin Mucinex Robitussin DM (plain only, alcohol free) Saline nasal spray/drops Sudafed (pseudoephedrine) & Actifed ** use only after [redacted] weeks gestation and if you do not have high blood pressure Tylenol Vicks Vaporub Zinc lozenges Zyrtec   Constipation: Colace Ducolax suppositories Fleet enema Glycerin suppositories Metamucil Milk of magnesia Miralax Senokot Smooth move tea  Diarrhea: Kaopectate Imodium A-D  *NO pepto Bismol  Hemorrhoids: Anusol Anusol HC Preparation H Tucks  Indigestion: Tums Maalox Mylanta Zantac  Pepcid  Insomnia: Benadryl (alcohol free) 25mg  every 6 hours as needed Tylenol  PM Unisom, no Gelcaps  Leg Cramps: Tums MagGel  Nausea/Vomiting:  Bonine Dramamine Emetrol Ginger extract Sea bands Meclizine  Nausea medication to take during pregnancy:  Unisom (doxylamine succinate 25 mg tablets) Take one tablet daily at bedtime. If symptoms are not adequately controlled, the dose can be increased to a maximum recommended dose of two tablets daily (1/2 tablet in the morning, 1/2 tablet mid-afternoon and one at bedtime). Vitamin B6 100mg  tablets. Take one tablet twice a day (up to 200 mg per day).  Skin Rashes: Aveeno products Benadryl cream or 25mg  every 6 hours as needed Calamine Lotion 1% cortisone cream  Yeast infection: Gyne-lotrimin 7 Monistat 7   **If taking multiple medications, please check labels to avoid duplicating the same active ingredients **take medication as directed on the label ** Do not exceed 4000 mg of tylenol in 24 hours **Do not take medications that contain aspirin or ibuprofen

## 2017-12-06 ENCOUNTER — Inpatient Hospital Stay (HOSPITAL_COMMUNITY)
Admission: AD | Admit: 2017-12-06 | Discharge: 2017-12-06 | Disposition: A | Discharge status: Home / Self Care | Payer: Self-pay | Source: Ambulatory Visit | Attending: Obstetrics & Gynecology | Admitting: Obstetrics & Gynecology

## 2017-12-06 ENCOUNTER — Other Ambulatory Visit: Payer: Self-pay

## 2017-12-06 ENCOUNTER — Encounter: Payer: Self-pay | Admitting: Obstetrics and Gynecology

## 2017-12-06 ENCOUNTER — Inpatient Hospital Stay (HOSPITAL_COMMUNITY): Payer: Self-pay

## 2017-12-06 ENCOUNTER — Ambulatory Visit (INDEPENDENT_AMBULATORY_CARE_PROVIDER_SITE_OTHER): Payer: Self-pay | Admitting: Obstetrics and Gynecology

## 2017-12-06 ENCOUNTER — Encounter (HOSPITAL_COMMUNITY): Payer: Self-pay | Admitting: Student

## 2017-12-06 VITALS — BP 124/74 | HR 75 | Wt 154.2 lb

## 2017-12-06 DIAGNOSIS — O3680X0 Pregnancy with inconclusive fetal viability, not applicable or unspecified: Secondary | ICD-10-CM

## 2017-12-06 DIAGNOSIS — O26891 Other specified pregnancy related conditions, first trimester: Secondary | ICD-10-CM | POA: Insufficient documentation

## 2017-12-06 DIAGNOSIS — O039 Complete or unspecified spontaneous abortion without complication: Secondary | ICD-10-CM | POA: Insufficient documentation

## 2017-12-06 DIAGNOSIS — Z3A10 10 weeks gestation of pregnancy: Secondary | ICD-10-CM | POA: Insufficient documentation

## 2017-12-06 DIAGNOSIS — R109 Unspecified abdominal pain: Secondary | ICD-10-CM

## 2017-12-06 DIAGNOSIS — R1030 Lower abdominal pain, unspecified: Secondary | ICD-10-CM | POA: Insufficient documentation

## 2017-12-06 LAB — URINALYSIS, ROUTINE W REFLEX MICROSCOPIC
BACTERIA UA: NONE SEEN
Bilirubin Urine: NEGATIVE
Glucose, UA: NEGATIVE mg/dL
KETONES UR: 5 mg/dL — AB
Nitrite: NEGATIVE
PH: 5 (ref 5.0–8.0)
PROTEIN: NEGATIVE mg/dL
Specific Gravity, Urine: 1.012 (ref 1.005–1.030)

## 2017-12-06 LAB — CBC WITH DIFFERENTIAL/PLATELET
Basophils Absolute: 0 10*3/uL (ref 0.0–0.1)
Basophils Relative: 0 %
EOS PCT: 1 %
Eosinophils Absolute: 0.1 10*3/uL (ref 0.0–0.7)
HEMATOCRIT: 32.7 % — AB (ref 36.0–46.0)
Hemoglobin: 10.7 g/dL — ABNORMAL LOW (ref 12.0–15.0)
LYMPHS PCT: 25 %
Lymphs Abs: 2.5 10*3/uL (ref 0.7–4.0)
MCH: 26.6 pg (ref 26.0–34.0)
MCHC: 32.7 g/dL (ref 30.0–36.0)
MCV: 81.3 fL (ref 78.0–100.0)
MONO ABS: 1 10*3/uL (ref 0.1–1.0)
Monocytes Relative: 10 %
NEUTROS ABS: 6.4 10*3/uL (ref 1.7–7.7)
Neutrophils Relative %: 64 %
PLATELETS: 414 10*3/uL — AB (ref 150–400)
RBC: 4.02 MIL/uL (ref 3.87–5.11)
RDW: 15.2 % (ref 11.5–15.5)
WBC: 9.9 10*3/uL (ref 4.0–10.5)

## 2017-12-06 LAB — POCT URINALYSIS DIP (DEVICE)
BILIRUBIN URINE: NEGATIVE
Glucose, UA: NEGATIVE mg/dL
KETONES UR: NEGATIVE mg/dL
Leukocytes, UA: NEGATIVE
Nitrite: NEGATIVE
PH: 6 (ref 5.0–8.0)
Protein, ur: NEGATIVE mg/dL
Specific Gravity, Urine: 1.02 (ref 1.005–1.030)
Urobilinogen, UA: 0.2 mg/dL (ref 0.0–1.0)

## 2017-12-06 NOTE — MAU Note (Signed)
End of Shift report given to Andee Poles, Therapist, sports.

## 2017-12-06 NOTE — MAU Note (Signed)
Pt presents with c/o lower abdominal cramping, denies VB. Pt reports seen November 24, 2017 secondary VB, had vaginal U/S and FHT noted.  Reports had first office visit today, abdominal U/S performed and no heart beat noted.  Pt states scheduled for repeat U/S in am, but anxious and wants to be seen this evening.

## 2017-12-06 NOTE — Progress Notes (Signed)
Pt presents for New OB. U/S unable to demonstrate IUP Pt was seen in MAU on 11/24/17 with vaginal bleeding U/S noted IUP with + cardiac activity Bleeding stopped 3 days ago. No cramps Discussed with pt Will schedule for formal U/S tomorrow F/U accordingly.

## 2017-12-06 NOTE — MAU Provider Note (Signed)
History     CSN: [id number]  Arrival date and time: 12/06/17 1728   First Provider Initiated Contact with Patient 12/06/17 2029      Chief Complaint  Patient presents with  . Cramping    Ana Moore is a 32 y.o. G1P0000 at [redacted]w[redacted]d who presents with abdominal cramping. Reports lower abdominal cramping for the last few weeks. Had ultrasound last month that showed IUP with FHR. Was in office today for initial ob app. Was told by Dr. Rip Harbour that they couldn't find IUP on ultrasound. Was discharged home with the plan of having a formal ultrasound tomorrow. States she was very anxious & couldn't wait until tomorrow. Denies vaginal bleeding.   OB History    Gravida Para Term Preterm AB Living   1 0 0 0 0     SAB TAB Ectopic Multiple Live Births   0 0 0          Past Medical History:  Diagnosis Date  . Physiological ovarian cysts   . Thyroid enlarged     Past Surgical History:  Procedure Laterality Date  . I&D EXTREMITY Right 03/27/2016   Procedure: IRRIGATION AND DEBRIDEMENT RIGHT KNEE, LEFT WRIST SPLINT, RIGHT ULNA/RADIUS SPLINT;  Surgeon: Renette Butters, MD;  Location: [place];  Service: Orthopedics;  Laterality: Right;  aPPLICATION OF STERI-STRIPS TO LEFT ORBITAL LAC.  Marland Kitchen ORIF WRIST FRACTURE Bilateral 03/30/2016   Procedure: OPEN REDUCTION INTERNAL FIXATION (ORIF) WRIST FRACTURE;  Surgeon: Renette Butters, MD;  Location: [place];  Service: Orthopedics;  Laterality: Bilateral;    No family history on file.  Social History   Tobacco Use  . Smoking status: Never Smoker  . Smokeless tobacco: Never Used  Substance Use Topics  . Alcohol use: Yes    Alcohol/week: 1.2 oz    Types: 2 Cans of beer per week    Comment: occasional before knowledge of pregnancy  . Drug use: No    Allergies:  Allergies  Allergen Reactions  . Shellfish Allergy Itching and Swelling    Mouth itching and swelling  . Oxycodone Itching    Medications Prior to Admission  Medication Sig Dispense  Refill Last Dose  . acetaminophen (TYLENOL) 500 MG tablet Take 1,000 mg by mouth every 6 (six) hours as needed for pain.   Taking  . Prenatal Vit-Fe Fumarate-FA (PRENATAL MULTIVITAMIN) TABS tablet Take 1 tablet by mouth daily at 12 noon.   Taking    Review of Systems  Constitutional: Negative.   Gastrointestinal: Positive for abdominal pain.  Genitourinary: Negative.    Physical Exam   Blood pressure 130/72, pulse 78, temperature 98.5 F (36.9 C), temperature source Oral, resp. rate 18, height 5\' 4"  (1.626 m), weight 154 lb 12 oz (70.2 kg), last menstrual period 09/12/2017, SpO2 100 %.  Physical Exam  Nursing note and vitals reviewed. Constitutional: She is oriented to person, place, and time. She appears well-developed and well-nourished. No distress.  HENT:  Head: Normocephalic and atraumatic.  Eyes: Conjunctivae are normal. Right eye exhibits no discharge. Left eye exhibits no discharge. No scleral icterus.  Neck: Normal range of motion.  Respiratory: Effort normal. No respiratory distress.  Neurological: She is alert and oriented to person, place, and time.  Skin: Skin is warm and dry. She is not diaphoretic.  Psychiatric: She has a normal mood and affect. Her behavior is normal. Judgment and thought content normal.    MAU Course  Procedures Results for orders placed or performed during the hospital  encounter of 12/06/17 (from the past 24 hour(s))  Urinalysis, Routine w reflex microscopic     Status: Abnormal   Collection Time: 12/06/17  8:21 PM  Result Value Ref Range   Color, Urine YELLOW YELLOW   APPearance CLEAR CLEAR   Specific Gravity, Urine 1.012 1.005 - 1.030   pH 5.0 5.0 - 8.0   Glucose, UA NEGATIVE NEGATIVE mg/dL   Hgb urine dipstick MODERATE (A) NEGATIVE   Bilirubin Urine NEGATIVE NEGATIVE   Ketones, ur 5 (A) NEGATIVE mg/dL   Protein, ur NEGATIVE NEGATIVE mg/dL   Nitrite NEGATIVE NEGATIVE   Leukocytes, UA TRACE (A) NEGATIVE   RBC / HPF 0-5 0 - 5 RBC/hpf    WBC, UA 0-5 0 - 5 WBC/hpf   Bacteria, UA NONE SEEN NONE SEEN   Squamous Epithelial / LPF 0-5 (A) NONE SEEN   Mucus PRESENT     MDM CBC Ultrasound pending  Care turned over to [place]   Jorje Guild, NP 12/06/2017 9:09 PM  Care turned over to Darrol Poke , CNM prior to patient returning [place], CNM 12/06/2017 10:07 PM   Ultrasound results shows previously identified IUP is no longer visualized and a 3.1SH complicated cyst is located on the left ovary. Discussed Ultrasound results with patient. Patient states she has history of cyst since 2012 and denies abdominal and pelvic pain.  Assessment and Plan    1. Lower abdominal pain 2. Complete miscarriage  Discharge patient  Follow up in 1 week in the office for follow up appointment with provider for complete miscarriage  Return to MAU as needed for increased pain not controlled by pain medication and increased bleeding   Lajean Manes, CNM 12/06/17, 10:47 PM

## 2017-12-07 ENCOUNTER — Ambulatory Visit (HOSPITAL_COMMUNITY): Payer: Self-pay | Attending: Obstetrics and Gynecology

## 2017-12-13 ENCOUNTER — Ambulatory Visit: Payer: Self-pay | Admitting: Certified Nurse Midwife

## 2017-12-13 ENCOUNTER — Encounter: Payer: Self-pay | Admitting: *Deleted

## 2017-12-13 ENCOUNTER — Telehealth: Payer: Self-pay | Admitting: *Deleted

## 2017-12-13 NOTE — Telephone Encounter (Signed)
Ana Moore [facility] for follow up after miscarriage. I called and left a message notifying her ; will send letter.

## 2018-05-13 ENCOUNTER — Emergency Department (HOSPITAL_COMMUNITY)
Admission: EM | Admit: 2018-05-13 | Discharge: 2018-05-13 | Disposition: A | Discharge status: Home / Self Care | Payer: Self-pay | Attending: Emergency Medicine | Admitting: Emergency Medicine

## 2018-05-13 ENCOUNTER — Encounter (HOSPITAL_COMMUNITY): Payer: Self-pay

## 2018-05-13 ENCOUNTER — Other Ambulatory Visit: Payer: Self-pay

## 2018-05-13 DIAGNOSIS — S39012A Strain of muscle, fascia and tendon of lower back, initial encounter: Secondary | ICD-10-CM

## 2018-05-13 DIAGNOSIS — M545 Low back pain: Secondary | ICD-10-CM | POA: Insufficient documentation

## 2018-05-13 MED ORDER — CYCLOBENZAPRINE HCL 5 MG PO TABS: 5.0000 mg | ORAL_TABLET | Freq: Two times a day (BID) | ORAL | 0 refills | Status: DC | PRN

## 2018-05-13 NOTE — ED Provider Notes (Signed)
[facility] EMERGENCY DEPARTMENT Provider Note   CSN: [id number] Arrival date & time: 05/13/18  1401     History   Chief Complaint Chief Complaint  Patient presents with  . Back Pain    HPI Ana Moore is a 32 y.o. female.  Pt comes in with c/o left lower back pain that started yesterday. Denies numbness or weakness. Has similar pain that were up higher that lasted about 2 week but have resolved. Denies dysuria     Past Medical History:  Diagnosis Date  . Physiological ovarian cysts   . Thyroid enlarged     There are no active problems to display for this patient.   Past Surgical History:  Procedure Laterality Date  . I&D EXTREMITY Right 03/27/2016   Procedure: IRRIGATION AND DEBRIDEMENT RIGHT KNEE, LEFT WRIST SPLINT, RIGHT ULNA/RADIUS SPLINT;  Surgeon: Renette Butters, MD;  Location: [place];  Service: Orthopedics;  Laterality: Right;  aPPLICATION OF STERI-STRIPS TO LEFT ORBITAL LAC.  Marland Kitchen ORIF WRIST FRACTURE Bilateral 03/30/2016   Procedure: OPEN REDUCTION INTERNAL FIXATION (ORIF) WRIST FRACTURE;  Surgeon: Renette Butters, MD;  Location: [place];  Service: Orthopedics;  Laterality: Bilateral;     OB History    Gravida  1   Para  0   Term  0   Preterm  0   AB  0   Living        SAB  0   TAB  0   Ectopic  0   Multiple      Live Births               Home Medications    Prior to Admission medications   Medication Sig Start Date End Date Taking? Authorizing Provider  acetaminophen (TYLENOL) 500 MG tablet Take 1,000 mg by mouth every 6 (six) hours as needed for pain.    [provider]    Family History No family history on file.  Social History Social History   Tobacco Use  . Smoking status: Never Smoker  . Smokeless tobacco: Never Used  Substance Use Topics  . Alcohol use: No    Frequency: Never  . Drug use: No     Allergies   Shellfish allergy and Oxycodone   Review of Systems Review of Systems   All other systems reviewed and are negative.    Physical Exam Updated Vital Signs BP 135/79 (BP Location: Right Arm)   Pulse 64   Temp 98.3 F (36.8 C) (Oral)   Resp 16   Ht 5\' 4"  (1.626 m)   Wt 72.6 kg (160 lb)   LMP 09/12/2017 (Approximate)   SpO2 99%   Breastfeeding? Unknown   BMI 27.46 kg/m   Physical Exam  Constitutional: She is oriented to person, place, and time. She appears well-developed and well-nourished.  Cardiovascular: Normal rate and regular rhythm.  Pulmonary/Chest: Effort normal and breath sounds normal.  Musculoskeletal: Normal range of motion.  Left lumbar paraspinal tenderness.   Neurological: She is alert and oriented to person, place, and time.  Skin: Skin is warm and dry.  Psychiatric: She has a normal mood and affect.  Nursing note and vitals reviewed.    ED Treatments / Results  Labs (all labs ordered are listed, but only abnormal results are displayed) Labs Reviewed - No data to display  EKG None  Radiology No results found.  Procedures Procedures (including critical care time)  Medications Ordered in ED Medications - No data to  display   Initial Impression / Assessment and Plan / ED Course  I have reviewed the triage vital signs and the nursing notes.  Pertinent labs & imaging results that were available during my care of the patient were reviewed by me and considered in my medical decision making (see chart for details).     Will treat symptomatically with flexeril. No red flag symptoms  Final Clinical Impressions(s) / ED Diagnoses   Final diagnoses:  None    ED Discharge Orders    None       Glendell Docker, NP 05/13/18 1514    Nat Christen, MD 05/15/18 1038

## 2018-05-13 NOTE — ED Triage Notes (Signed)
Pt reports that she started having pain in her left breast and side that is now in her left lower back/side. Pt reports taking ibuprofen with no relief. Denies any urinary symptoms, discomfort, or blood in urine.

## 2018-05-13 NOTE — ED Notes (Signed)
Pt verbalized understanding discharge instructions and denies any further needs or questions at this time. VS stable, ambulatory and steady gait.   
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
## 2019-12-05 NOTE — L&D Delivery Note (Addendum)
OB/GYN Faculty Practice Delivery Note  Ana Moore is a 34 y.o. G2P0010 s/p SVD at [redacted]w[redacted]d. She was admitted for PPROM at 32.3.   ROM: 27h 88m with clear fluid GBS Status: unknown  Maximum Maternal Temperature: 98.7  Labor Progress: PPROM 10/9 at 1903 Admitted 10/10 0100 at 2.5/50%/-3 for expectant management with NICU consultation. Labor progressed without augmentation to complete on 10/10 2215, delivery as below  Delivery Date/Time: 09/12/20 @ 2242 Delivery: Called to room and patient was complete and pushing. Head delivered LOA. No nuchal cord present. Shoulder and body delivered in usual fashion. Infant with spontaneous cry, placed on mother's abdomen, dried and stimulated. Cord clamped x 2 after 1-minute delay, and cut by resident, Dr. Jeani Hawking. Cord blood drawn. Placenta delivered spontaneously with gentle cord traction. Fundus firm with massage and Pitocin. Labia, perineum, vagina, and cervix were inspected and notable for second degree perineal laceration.  Placenta: intact Complications: none Lacerations: second degree perineal laceration EBL: 50 ml Analgesia: epidural, lidocaine  Infant: female   APGARs 7,7   1770g   to NICU given GA  Ezequiel Essex, MD  I was gloved and present for delivery of infant and placenta. I performed laceration repair as noted above.  Randa Ngo, MD OB Fellow, Faculty Practice 09/13/2020 1:50 AM

## 2019-12-15 ENCOUNTER — Ambulatory Visit (HOSPITAL_COMMUNITY)
Admission: EM | Admit: 2019-12-15 | Discharge: 2019-12-15 | Disposition: A | Discharge status: Home / Self Care | Payer: Self-pay | Attending: Internal Medicine | Admitting: Internal Medicine

## 2019-12-15 ENCOUNTER — Other Ambulatory Visit: Payer: Self-pay

## 2019-12-15 ENCOUNTER — Encounter (HOSPITAL_COMMUNITY): Payer: Self-pay | Admitting: Emergency Medicine

## 2019-12-15 DIAGNOSIS — N939 Abnormal uterine and vaginal bleeding, unspecified: Secondary | ICD-10-CM | POA: Insufficient documentation

## 2019-12-15 DIAGNOSIS — Z3202 Encounter for pregnancy test, result negative: Secondary | ICD-10-CM

## 2019-12-15 LAB — CBC
HCT: 37.7 % (ref 36.0–46.0)
Hemoglobin: 11.6 g/dL — ABNORMAL LOW (ref 12.0–15.0)
MCH: 26.1 pg (ref 26.0–34.0)
MCHC: 30.8 g/dL (ref 30.0–36.0)
MCV: 84.7 fL (ref 80.0–100.0)
Platelets: 400 10*3/uL (ref 150–400)
RBC: 4.45 MIL/uL (ref 3.87–5.11)
RDW: 15 % (ref 11.5–15.5)
WBC: 9.3 10*3/uL (ref 4.0–10.5)
nRBC: 0 % (ref 0.0–0.2)

## 2019-12-15 LAB — POCT PREGNANCY, URINE: Preg Test, Ur: NEGATIVE

## 2019-12-15 LAB — POC URINE PREG, ED
Preg Test, Ur: NEGATIVE
Preg Test, Ur: NEGATIVE

## 2019-12-15 LAB — TSH: TSH: 0.692 u[IU]/mL (ref 0.350–4.500)

## 2019-12-15 NOTE — ED Triage Notes (Signed)
Pt reports vaginal discharge that started on Saturday. She denies any other symptoms, but states her menstrual cycle was really long.  She states she started spotting on Dec. 17 but did not get a full period until [place] and then she states she bled for two weeks.  Now she states she just started spotting again.  Pt says she took a pregnancy test and it was negative.

## 2019-12-15 NOTE — ED Provider Notes (Signed)
[place]    CSN: [id number] Arrival date & time: 12/15/19  1600      History   Chief Complaint Chief Complaint  Patient presents with  . Vaginal Discharge    HPI Ana Moore is a 34 y.o. female with no past medical history comes to urgent care with vaginal spotting of 2 days duration.  Patient has had regular menstrual cycle with her menstrual period on  December 31.  Her periods lasts 2 days.   Patient denies any abdominal pain.  She has not been sexually active since September 2020.  No nausea or vomiting.  No weight changes.  Patient denies any cold intolerance.  HPI  Past Medical History:  Diagnosis Date  . Physiological ovarian cysts   . Thyroid enlarged     There are no problems to display for this patient.   Past Surgical History:  Procedure Laterality Date  . I & D EXTREMITY Right 03/27/2016   Procedure: IRRIGATION AND DEBRIDEMENT RIGHT KNEE, LEFT WRIST SPLINT, RIGHT ULNA/RADIUS SPLINT;  Surgeon: Renette Butters, MD;  Location: [place];  Service: Orthopedics;  Laterality: Right;  aPPLICATION OF STERI-STRIPS TO LEFT ORBITAL LAC.  Marland Kitchen ORIF WRIST FRACTURE Bilateral 03/30/2016   Procedure: OPEN REDUCTION INTERNAL FIXATION (ORIF) WRIST FRACTURE;  Surgeon: Renette Butters, MD;  Location: [place];  Service: Orthopedics;  Laterality: Bilateral;    OB History    Gravida  1   Para  0   Term  0   Preterm  0   AB  0   Living        SAB  0   TAB  0   Ectopic  0   Multiple      Live Births               Home Medications    Prior to Admission medications   Medication Sig Start Date End Date Taking? Authorizing Provider  acetaminophen (TYLENOL) 500 MG tablet Take 1,000 mg by mouth every 6 (six) hours as needed for pain.    [provider]    Family History History reviewed. No pertinent family history.  Social History Social History   Tobacco Use  . Smoking status: Never Smoker  . Smokeless tobacco: Never Used   Substance Use Topics  . Alcohol use: No  . Drug use: No     Allergies   Shellfish allergy and Oxycodone   Review of Systems Review of Systems  Constitutional: Negative for activity change, chills, fatigue, fever and unexpected weight change.  Respiratory: Negative for cough, shortness of breath and wheezing.   Gastrointestinal: Negative for abdominal pain, diarrhea, nausea and vomiting.  Endocrine: Negative for cold intolerance, polydipsia, polyphagia and polyuria.  Genitourinary: Positive for vaginal bleeding. Negative for dysuria, frequency, urgency, vaginal discharge and vaginal pain.  Musculoskeletal: Negative for back pain, joint swelling and myalgias.  Skin: Negative.   Neurological: Negative for dizziness, light-headedness and headaches.  Psychiatric/Behavioral: Negative for confusion and decreased concentration.     Physical Exam Triage Vital Signs ED Triage Vitals  Enc Vitals Group     BP 12/15/19 1637 129/86     Pulse Rate 12/15/19 1637 73     Resp 12/15/19 1637 20     Temp 12/15/19 1637 98.1 F (36.7 C)     Temp Source 12/15/19 1637 Oral     SpO2 12/15/19 1637 100 %     Weight --      Height --  Head Circumference --      Peak Flow --      Pain Score 12/15/19 1636 0     Pain Loc --      Pain Edu? --      Excl. in [place]? --    No data found.  Updated Vital Signs BP 129/86 (BP Location: Left Arm)   Pulse 73   Temp 98.1 F (36.7 C) (Oral)   Resp 20   LMP 12/13/2019 (Exact Date)   SpO2 100%   Breastfeeding No   Visual Acuity Right Eye Distance:   Left Eye Distance:   Bilateral Distance:    Right Eye Near:   Left Eye Near:    Bilateral Near:     Physical Exam Vitals and nursing note reviewed.  Constitutional:      General: She is not in acute distress.    Appearance: Normal appearance. She is not ill-appearing.  Cardiovascular:     Rate and Rhythm: Normal rate and regular rhythm.     Pulses: Normal pulses.     Heart sounds: Normal heart  sounds.  Pulmonary:     Effort: Pulmonary effort is normal. No respiratory distress.     Breath sounds: Normal breath sounds. No stridor. No wheezing or rhonchi.  Abdominal:     General: Bowel sounds are normal.     Palpations: Abdomen is soft.     Tenderness: There is no abdominal tenderness. There is no guarding or rebound.     Hernia: No hernia is present.  Musculoskeletal:        General: No swelling or signs of injury. Normal range of motion.  Skin:    General: Skin is warm and dry.     Capillary Refill: Capillary refill takes less than 2 seconds.  Neurological:     General: No focal deficit present.     Mental Status: She is alert and oriented to person, place, and time.      UC Treatments / Results  Labs (all labs ordered are listed, but only abnormal results are displayed) Labs Reviewed  TSH  CBC  POC URINE PREG, ED  POCT PREGNANCY, URINE  POC URINE PREG, ED    EKG   Radiology No results found.  Procedures Procedures (including critical care time)  Medications Ordered in UC Medications - No data to display  Initial Impression / Assessment and Plan / UC Course  I have reviewed the triage vital signs and the nursing notes.  Pertinent labs & imaging results that were available during my care of the patient were reviewed by me and considered in my medical decision making (see chart for details).     1.  Vaginal spotting: TSH, CBC in AM Negative urine pregnancy test Patient will benefit from establishing primary OB/GYN for pelvic/transvaginal ultrasound. If patient has any worsening symptoms she is advised to return to urgent care to be reevaluated.  Final Clinical Impressions(s) / UC Diagnoses   Final diagnoses:  Vaginal spotting   Discharge Instructions   None    ED Prescriptions    None     PDMP not reviewed this encounter.   Chase Picket, MD 12/15/19 1819

## 2020-02-25 ENCOUNTER — Inpatient Hospital Stay (HOSPITAL_COMMUNITY)
Admission: AD | Admit: 2020-02-25 | Discharge: 2020-02-26 | Disposition: A | Discharge status: Home / Self Care | Payer: Medicaid Other | Attending: Obstetrics & Gynecology | Admitting: Obstetrics & Gynecology

## 2020-02-25 ENCOUNTER — Other Ambulatory Visit: Payer: Self-pay

## 2020-02-25 DIAGNOSIS — Z885 Allergy status to narcotic agent status: Secondary | ICD-10-CM | POA: Insufficient documentation

## 2020-02-25 DIAGNOSIS — Z3A01 Less than 8 weeks gestation of pregnancy: Secondary | ICD-10-CM | POA: Insufficient documentation

## 2020-02-25 DIAGNOSIS — O209 Hemorrhage in early pregnancy, unspecified: Secondary | ICD-10-CM | POA: Insufficient documentation

## 2020-02-25 DIAGNOSIS — O3680X0 Pregnancy with inconclusive fetal viability, not applicable or unspecified: Secondary | ICD-10-CM

## 2020-02-25 HISTORY — DX: Benign neoplasm of connective and other soft tissue, unspecified: D21.9

## 2020-02-26 ENCOUNTER — Inpatient Hospital Stay (HOSPITAL_COMMUNITY): Payer: Medicaid Other

## 2020-02-26 ENCOUNTER — Encounter (HOSPITAL_COMMUNITY): Payer: Self-pay | Admitting: *Deleted

## 2020-02-26 DIAGNOSIS — Z3A01 Less than 8 weeks gestation of pregnancy: Secondary | ICD-10-CM | POA: Diagnosis not present

## 2020-02-26 DIAGNOSIS — O209 Hemorrhage in early pregnancy, unspecified: Secondary | ICD-10-CM | POA: Diagnosis present

## 2020-02-26 DIAGNOSIS — Z885 Allergy status to narcotic agent status: Secondary | ICD-10-CM | POA: Diagnosis not present

## 2020-02-26 LAB — WET PREP, GENITAL
Sperm: NONE SEEN
Trich, Wet Prep: NONE SEEN
Yeast Wet Prep HPF POC: NONE SEEN

## 2020-02-26 LAB — URINALYSIS, ROUTINE W REFLEX MICROSCOPIC
Bilirubin Urine: NEGATIVE
Glucose, UA: NEGATIVE mg/dL
Ketones, ur: NEGATIVE mg/dL
Leukocytes,Ua: NEGATIVE
Nitrite: NEGATIVE
Protein, ur: NEGATIVE mg/dL
Specific Gravity, Urine: 1.005 (ref 1.005–1.030)
pH: 6 (ref 5.0–8.0)

## 2020-02-26 LAB — CBC
HCT: 36.3 % (ref 36.0–46.0)
Hemoglobin: 11.5 g/dL — ABNORMAL LOW (ref 12.0–15.0)
MCH: 26.4 pg (ref 26.0–34.0)
MCHC: 31.7 g/dL (ref 30.0–36.0)
MCV: 83.4 fL (ref 80.0–100.0)
Platelets: 324 10*3/uL (ref 150–400)
RBC: 4.35 MIL/uL (ref 3.87–5.11)
RDW: 15.9 % — ABNORMAL HIGH (ref 11.5–15.5)
WBC: 10.5 10*3/uL (ref 4.0–10.5)
nRBC: 0 % (ref 0.0–0.2)

## 2020-02-26 LAB — HIV ANTIBODY (ROUTINE TESTING W REFLEX): HIV Screen 4th Generation wRfx: NONREACTIVE

## 2020-02-26 LAB — HCG, QUANTITATIVE, PREGNANCY: hCG, Beta Chain, Quant, S: 951 m[IU]/mL — ABNORMAL HIGH (ref <5)

## 2020-02-26 LAB — POCT PREGNANCY, URINE: Preg Test, Ur: POSITIVE — AB

## 2020-02-26 NOTE — MAU Note (Signed)
Sat started having vag bleeding. Took upt Sunday and Monday and today and all positive. Sometimes has small clots and blood is dark. Bleeding has slowed some today. LMP 2/24. Mild cramping in lower abd.

## 2020-02-26 NOTE — Discharge Instructions (Signed)

## 2020-02-27 LAB — GC/CHLAMYDIA PROBE AMP (~~LOC~~) NOT AT ARMC
Chlamydia: NEGATIVE
Comment: NEGATIVE
Comment: NORMAL
Neisseria Gonorrhea: NEGATIVE

## 2020-02-28 ENCOUNTER — Inpatient Hospital Stay (HOSPITAL_COMMUNITY)
Admission: AD | Admit: 2020-02-28 | Discharge: 2020-02-28 | Disposition: A | Discharge status: Home / Self Care | Payer: Medicaid Other | Attending: Obstetrics & Gynecology | Admitting: Obstetrics & Gynecology

## 2020-02-28 ENCOUNTER — Other Ambulatory Visit: Payer: Self-pay

## 2020-02-28 DIAGNOSIS — D259 Leiomyoma of uterus, unspecified: Secondary | ICD-10-CM | POA: Diagnosis not present

## 2020-02-28 DIAGNOSIS — O3411 Maternal care for benign tumor of corpus uteri, first trimester: Secondary | ICD-10-CM | POA: Diagnosis not present

## 2020-02-28 DIAGNOSIS — E349 Endocrine disorder, unspecified: Secondary | ICD-10-CM

## 2020-02-28 DIAGNOSIS — O26891 Other specified pregnancy related conditions, first trimester: Secondary | ICD-10-CM | POA: Insufficient documentation

## 2020-02-28 DIAGNOSIS — Z3A01 Less than 8 weeks gestation of pregnancy: Secondary | ICD-10-CM | POA: Insufficient documentation

## 2020-02-28 DIAGNOSIS — N83209 Unspecified ovarian cyst, unspecified side: Secondary | ICD-10-CM | POA: Diagnosis not present

## 2020-02-28 DIAGNOSIS — O3481 Maternal care for other abnormalities of pelvic organs, first trimester: Secondary | ICD-10-CM | POA: Insufficient documentation

## 2020-02-28 LAB — HCG, QUANTITATIVE, PREGNANCY: hCG, Beta Chain, Quant, S: 2958 m[IU]/mL — ABNORMAL HIGH (ref <5)

## 2020-02-28 NOTE — Discharge Instructions (Signed)
Center for [place] for [place] locations:  Hours may vary. Please call for an appointment  [facility] for [place] @ [place]  [phone]  [facility] [place] @ [place] She has a history of ovarian cysts and fibroids.   [address]  [phone]  [place] @ [facility]       [address] [phone]            [facility] for [place] @ [place]     [phone]          [facility] for [place] @ [facility]   [phone]  [facility] for [place] @ [place] [phone]     Center for [place] @ [facility])  [place]   [phone]

## 2020-02-28 NOTE — MAU Provider Note (Signed)
Patient Ana Moore is a 34 y.o. G2P0010  at [redacted]w[redacted]d here for follow up bHCG. She was seen in MAU on 02/26/2020 and was diagnosed with pregnancy of unknown location. She was having bleeding and pain (4/10) that day; [place] that day showed multiple fibroids, ovarian cyst, and no gestational sac. Today she reports no pain today, just a mix of brown and red discharge.   Patient's bHCG on 3/25 was 951, today her bHCG is 2958, signaling an appropriate rise in bHCG.   Patient will start prenatal care at [facility]; she will call to start prenatal care at 11-12 weeks.   Mervyn Skeeters Tashina Credit 02/28/2020, 3:11 PM

## 2020-02-28 NOTE — MAU Provider Note (Signed)
Chief Complaint: Abdominal Pain and Vaginal Bleeding   First Provider Initiated Contact with Patient 02/26/20 0038        SUBJECTIVE HPI: Ana Moore is a 34 y.o. G2P0010 at [redacted]w[redacted]d by LMP who presents to maternity admissions reporting vaginal bleeding with clots since Monday.  Has mild cramps. . She denies vaginal itching/burning, urinary symptoms, h/a, dizziness, n/v, or fever/chills.     Abdominal Pain This is a new problem. The current episode started in the past 7 days. The onset quality is gradual. The problem occurs intermittently. The problem has been unchanged. The pain is located in the suprapubic region. The pain is mild. The quality of the pain is cramping. The abdominal pain does not radiate. Pertinent negatives include no constipation, diarrhea, dysuria, fever or headaches. Nothing aggravates the pain. The pain is relieved by nothing. She has tried nothing for the symptoms.  Vaginal Bleeding The patient's primary symptoms include pelvic pain and vaginal bleeding. The patient's pertinent negatives include no genital itching, genital lesions or genital odor. This is a new problem. The current episode started in the past 7 days. She is pregnant. Associated symptoms include abdominal pain. Pertinent negatives include no constipation, diarrhea, dysuria, fever or headaches. The vaginal discharge was bloody. The vaginal bleeding is lighter than menses. She has been passing clots. She has not been passing tissue. Nothing aggravates the symptoms. She has tried nothing for the symptoms.    RN note: Sat started having vag bleeding. Took upt Sunday and Monday and today and all positive. Sometimes has small clots and blood is dark. Bleeding has slowed some today. LMP 2/24. Mild cramping in lower abd.   Past Medical History:  Diagnosis Date  . Fibroid   . Physiological ovarian cysts   . Thyroid enlarged    Past Surgical History:  Procedure Laterality Date  . I & D EXTREMITY Right  03/27/2016   Procedure: IRRIGATION AND DEBRIDEMENT RIGHT KNEE, LEFT WRIST SPLINT, RIGHT ULNA/RADIUS SPLINT;  Surgeon: Renette Butters, MD;  Location: [facility];  Service: Orthopedics;  Laterality: Right;  aPPLICATION OF STERI-STRIPS TO LEFT ORBITAL LAC.  Marland Kitchen ORIF WRIST FRACTURE Bilateral 03/30/2016   Procedure: OPEN REDUCTION INTERNAL FIXATION (ORIF) WRIST FRACTURE;  Surgeon: Renette Butters, MD;  Location: [facility];  Service: Orthopedics;  Laterality: Bilateral;   Social History   Socioeconomic History  . Marital status: Single    Spouse name: Not on file  . Number of children: Not on file  . Years of education: Not on file  . Highest education level: Not on file  Occupational History  . Not on file  Tobacco Use  . Smoking status: Never Smoker  . Smokeless tobacco: Never Used  Substance and Sexual Activity  . Alcohol use: Not Currently  . Drug use: No  . Sexual activity: Not Currently    Birth control/protection: None  Other Topics Concern  . Not on file  Social History Narrative   ** Merged History Encounter **       Social Determinants of Health   Financial Resource Strain:   . Difficulty of Paying Living Expenses:   Food Insecurity:   . Worried About Charity fundraiser in the Last Year:   . Arboriculturist in the Last Year:   Transportation Needs:   . Film/video editor (Medical):   Marland Kitchen Lack of Transportation (Non-Medical):   Physical Activity:   . Days of Exercise per Week:   . Minutes of Exercise per  Session:   Stress:   . Feeling of Stress :   Social Connections:   . Frequency of Communication with Friends and Family:   . Frequency of Social Gatherings with Friends and Family:   . Attends Religious Services:   . Active Member of Clubs or Organizations:   . Attends Archivist Meetings:   Marland Kitchen Marital Status:   Intimate Partner Violence:   . Fear of Current or Ex-Partner:   . Emotionally Abused:   Marland Kitchen Physically Abused:   . Sexually Abused:    No current  facility-administered medications on file prior to encounter.   Current Outpatient Medications on File Prior to Encounter  Medication Sig Dispense Refill  . acetaminophen (TYLENOL) 500 MG tablet Take 1,000 mg by mouth every 6 (six) hours as needed for pain.     Allergies  Allergen Reactions  . Shellfish Allergy Itching and Swelling    Mouth itching and swelling  . Oxycodone Itching    I have reviewed patient's Past Medical Hx, Surgical Hx, Family Hx, Social Hx, medications and allergies.   ROS:  Review of Systems  Constitutional: Negative for fever.  Gastrointestinal: Positive for abdominal pain. Negative for constipation and diarrhea.  Genitourinary: Positive for pelvic pain and vaginal bleeding. Negative for dysuria.  Neurological: Negative for headaches.   Review of Systems  Other systems negative   Physical Exam  Physical Exam No data found. Constitutional: Well-developed, well-nourished female in no acute distress.  Cardiovascular: normal rate Respiratory: normal effort GI: Abd soft, non-tender. Pos BS x 4 MS: Extremities nontender, no edema, normal ROM Neurologic: Alert and oriented x 4.  GU: Neg CVAT.  PELVIC EXAM: Cervix pink, visually closed, without lesion, small bloody discharge, vaginal walls and external genitalia normal Bimanual exam: Cervix 0/long/high, firm, anterior, neg CMT, uterus nontender, nonenlarged, adnexa without tenderness, enlargement, or mass   LAB RESULTS Results for orders placed or performed during the hospital encounter of 02/25/20 (from the past 72 hour(s))  Urinalysis, Routine w reflex microscopic     Status: Abnormal   Collection Time: 02/26/20 12:22 AM  Result Value Ref Range   Color, Urine STRAW (A) YELLOW   APPearance CLEAR CLEAR   Specific Gravity, Urine 1.005 1.005 - 1.030   pH 6.0 5.0 - 8.0   Glucose, UA NEGATIVE NEGATIVE mg/dL   Hgb urine dipstick LARGE (A) NEGATIVE   Bilirubin Urine NEGATIVE NEGATIVE   Ketones, ur  NEGATIVE NEGATIVE mg/dL   Protein, ur NEGATIVE NEGATIVE mg/dL   Nitrite NEGATIVE NEGATIVE   Leukocytes,Ua NEGATIVE NEGATIVE   RBC / HPF 0-5 0 - 5 RBC/hpf   WBC, UA 0-5 0 - 5 WBC/hpf   Bacteria, UA RARE (A) NONE SEEN   Squamous Epithelial / LPF 0-5 0 - 5   Mucus PRESENT     Comment: Performed at [facility], [address] [place], [place] [address]  Pregnancy, urine POC     Status: Abnormal   Collection Time: 02/26/20 12:25 AM  Result Value Ref Range   Preg Test, Ur POSITIVE (A) NEGATIVE    Comment:        THE SENSITIVITY OF THIS METHODOLOGY IS >24 mIU/mL   GC/Chlamydia probe amp ([place])not at [facility]     Status: None   Collection Time: 02/26/20 12:39 AM  Result Value Ref Range   Neisseria Gonorrhea Negative    Chlamydia Negative    Comment Normal Reference Ranger Chlamydia - Negative    Comment  Normal Reference Range Neisseria Gonorrhea - Negative  Wet prep, genital     Status: Abnormal   Collection Time: 02/26/20 12:50 AM  Result Value Ref Range   Yeast Wet Prep HPF POC NONE SEEN NONE SEEN   Trich, Wet Prep NONE SEEN NONE SEEN   Clue Cells Wet Prep HPF POC PRESENT (A) NONE SEEN   WBC, Wet Prep HPF POC MANY (A) NONE SEEN   Sperm NONE SEEN     Comment: Performed at [facility], [place] [address], [place], [place] [address]  CBC     Status: Abnormal   Collection Time: 02/26/20 12:53 AM  Result Value Ref Range   WBC 10.5 4.0 - 10.5 K/uL   RBC 4.35 3.87 - 5.11 MIL/uL   Hemoglobin 11.5 (L) 12.0 - 15.0 g/dL   HCT 36.3 36.0 - 46.0 %   MCV 83.4 80.0 - 100.0 fL   MCH 26.4 26.0 - 34.0 pg   MCHC 31.7 30.0 - 36.0 g/dL   RDW 15.9 (H) 11.5 - 15.5 %   Platelets 324 150 - 400 K/uL   nRBC 0.0 0.0 - 0.2 %    Comment: Performed at [facility], [place] [address], [place], [place] [address]  hCG, quantitative, pregnancy     Status: Abnormal   Collection Time: 02/26/20 12:53 AM  Result Value Ref Range   hCG, Beta Chain, Quant, S 951 (H) <5 mIU/mL    Comment:           GEST. AGE      CONC.  (mIU/mL)   <=1 WEEK        5 - 50     2 WEEKS       50 - 500     3 WEEKS       100 - 10,000     4 WEEKS     1,000 - 30,000     5 WEEKS     3,500 - 115,000   6-8 WEEKS     12,000 - 270,000    12 WEEKS     15,000 - 220,000        FEMALE AND NON-PREGNANT FEMALE:     LESS THAN 5 mIU/mL Performed at [facility], [place] [address], [place], [place] [address]   HIV Antibody (routine testing w rflx)     Status: None   Collection Time: 02/26/20 12:53 AM  Result Value Ref Range   HIV Screen 4th Generation wRfx NON REACTIVE NON REACTIVE    Comment: Performed at [facility] [address] [place], [place] [address]     IMAGING US OB LESS THAN 14 WEEKS WITH OB TRANSVAGINAL  Result Date: 02/26/2020 CLINICAL DATA:  Bleeding EXAM: OBSTETRIC <14 WK [place] AND TRANSVAGINAL OB US TECHNIQUE: Both transabdominal and transvaginal ultrasound examinations were performed for complete evaluation of the gestation as well as the maternal uterus, adnexal regions, and pelvic cul-de-sac. Transvaginal technique was performed to assess early pregnancy. COMPARISON:  None. FINDINGS: Intrauterine gestational sac: None Yolk sac:  Not visualized Embryo:  Not visualized Cardiac Activity: Not visualized Heart Rate:   bpm MSD:   mm    w     d CRL:    mm    w    d                  [place] EDC: Subchorionic hemorrhage:  None visualized. Maternal uterus/adnexae: Complex cysts within the left ovary, 1  measuring up to 4.5 cm and a 2nd measuring up to 3.4 cm. Internal echoes most compatible with hemorrhagic cysts. Multiple uterine fibroids, the largest in the right lateral uterus measuring up to 2.7 cm. Trace free fluid in the pelvis. IMPRESSION: No intrauterine pregnancy visualized. Differential considerations would include early intrauterine pregnancy too early to visualize, spontaneous abortion, or occult ectopic pregnancy. Recommend close clinical followup and serial quantitative beta HCGs and  ultrasounds. Two complex cysts within the left ovary, likely hemorrhagic cysts. Multiple uterine fibroids. Electronically Signed   By: Rolm Baptise M.D.   On: 02/26/2020 01:43    MAU Management/MDM: Ordered usual first trimester r/o ectopic labs.   Pelvic exam and cultures done Will check baseline Ultrasound to rule out ectopic.  This bleeding/pain can represent a normal pregnancy with bleeding, spontaneous abortion or even an ectopic which can be life-threatening.  The process as listed above helps to determine which of these is present.  Reviewed results Will recommend repeat HCG in 48 hours and then repeat US in 7-10 days  ASSESSMENT 1. Vaginal bleeding in pregnancy, first trimester   2. Pregnancy of unknown anatomic location     PLAN Discharge home Plan to repeat HCG level in 48 hours in MAU Will repeat  Ultrasound in about 7-10 days if HCG levels double appropriately  Ectopic precautions  Follow-up Information    [facility]. Go in 2 day(s).   Specialty: Obstetrics and Gynecology Contact information: [address] [id number] [place] [phone]         Pt stable at time of discharge. Encouraged to return here or to other Urgent Care/ED if she develops worsening of symptoms, increase in pain, fever, or other concerning symptoms.    Hansel Feinstein CNM, MSN Certified Nurse-Midwife 02/28/2020  1:18 PM

## 2020-02-28 NOTE — MAU Note (Signed)
Ana Moore is a 34 y.o. at [redacted]w[redacted]d here in MAU reporting: here for follow up hcg. Bleeding is the same as previous visit, is wearing a panty liner and changing it about 2x per day. No pain.  Pain score: 0/10  Vitals:   02/28/20 1330  BP: 126/65  Pulse: 83  Resp: 15  Temp: 98.7 F (37.1 C)  SpO2: 100%     Lab orders placed from triage: hcg

## 2020-03-18 ENCOUNTER — Inpatient Hospital Stay (HOSPITAL_COMMUNITY): Payer: Medicaid Other

## 2020-03-18 ENCOUNTER — Other Ambulatory Visit: Payer: Self-pay

## 2020-03-18 ENCOUNTER — Encounter (HOSPITAL_COMMUNITY): Payer: Self-pay | Admitting: Obstetrics and Gynecology

## 2020-03-18 ENCOUNTER — Inpatient Hospital Stay (HOSPITAL_COMMUNITY)
Admission: AD | Admit: 2020-03-18 | Discharge: 2020-03-18 | Disposition: A | Discharge status: Home / Self Care | Payer: Medicaid Other | Attending: Obstetrics and Gynecology | Admitting: Obstetrics and Gynecology

## 2020-03-18 DIAGNOSIS — Z87440 Personal history of urinary (tract) infections: Secondary | ICD-10-CM | POA: Diagnosis not present

## 2020-03-18 DIAGNOSIS — O209 Hemorrhage in early pregnancy, unspecified: Secondary | ICD-10-CM | POA: Diagnosis not present

## 2020-03-18 DIAGNOSIS — O3411 Maternal care for benign tumor of corpus uteri, first trimester: Secondary | ICD-10-CM | POA: Insufficient documentation

## 2020-03-18 DIAGNOSIS — Z885 Allergy status to narcotic agent status: Secondary | ICD-10-CM | POA: Diagnosis not present

## 2020-03-18 DIAGNOSIS — O99611 Diseases of the digestive system complicating pregnancy, first trimester: Secondary | ICD-10-CM | POA: Insufficient documentation

## 2020-03-18 DIAGNOSIS — R109 Unspecified abdominal pain: Secondary | ICD-10-CM | POA: Diagnosis present

## 2020-03-18 DIAGNOSIS — Z3A01 Less than 8 weeks gestation of pregnancy: Secondary | ICD-10-CM | POA: Insufficient documentation

## 2020-03-18 DIAGNOSIS — Z91013 Allergy to seafood: Secondary | ICD-10-CM | POA: Insufficient documentation

## 2020-03-18 DIAGNOSIS — Z8249 Family history of ischemic heart disease and other diseases of the circulatory system: Secondary | ICD-10-CM | POA: Diagnosis not present

## 2020-03-18 DIAGNOSIS — D252 Subserosal leiomyoma of uterus: Secondary | ICD-10-CM | POA: Diagnosis not present

## 2020-03-18 DIAGNOSIS — K5900 Constipation, unspecified: Secondary | ICD-10-CM | POA: Diagnosis not present

## 2020-03-18 DIAGNOSIS — Z3490 Encounter for supervision of normal pregnancy, unspecified, unspecified trimester: Secondary | ICD-10-CM

## 2020-03-18 HISTORY — DX: Unspecified infectious disease: B99.9

## 2020-03-18 LAB — URINALYSIS, ROUTINE W REFLEX MICROSCOPIC
Bilirubin Urine: NEGATIVE
Glucose, UA: NEGATIVE mg/dL
Ketones, ur: NEGATIVE mg/dL
Leukocytes,Ua: NEGATIVE
Nitrite: NEGATIVE
Protein, ur: NEGATIVE mg/dL
Specific Gravity, Urine: 1.006 (ref 1.005–1.030)
pH: 6 (ref 5.0–8.0)

## 2020-03-18 LAB — CBC
HCT: 37.4 % (ref 36.0–46.0)
Hemoglobin: 11.9 g/dL — ABNORMAL LOW (ref 12.0–15.0)
MCH: 26.7 pg (ref 26.0–34.0)
MCHC: 31.8 g/dL (ref 30.0–36.0)
MCV: 83.9 fL (ref 80.0–100.0)
Platelets: 340 10*3/uL (ref 150–400)
RBC: 4.46 MIL/uL (ref 3.87–5.11)
RDW: 15.2 % (ref 11.5–15.5)
WBC: 9.5 10*3/uL (ref 4.0–10.5)
nRBC: 0 % (ref 0.0–0.2)

## 2020-03-18 LAB — HCG, QUANTITATIVE, PREGNANCY: hCG, Beta Chain, Quant, S: 71937 m[IU]/mL — ABNORMAL HIGH (ref <5)
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
## 2020-03-18 NOTE — MAU Note (Signed)
Has been spotting since found she was pregnant.  Today is heavier, just flowing out instead of only when she wipes. Has not soaked a pad. Mild cramping on left side.

## 2020-03-18 NOTE — Discharge Instructions (Signed)

## 2020-04-08 ENCOUNTER — Other Ambulatory Visit: Payer: Self-pay

## 2020-04-08 ENCOUNTER — Ambulatory Visit (INDEPENDENT_AMBULATORY_CARE_PROVIDER_SITE_OTHER): Payer: Self-pay | Admitting: *Deleted

## 2020-04-08 DIAGNOSIS — O99210 Obesity complicating pregnancy, unspecified trimester: Secondary | ICD-10-CM

## 2020-04-08 DIAGNOSIS — N83209 Unspecified ovarian cyst, unspecified side: Secondary | ICD-10-CM

## 2020-04-08 DIAGNOSIS — D219 Benign neoplasm of connective and other soft tissue, unspecified: Secondary | ICD-10-CM

## 2020-04-08 DIAGNOSIS — E049 Nontoxic goiter, unspecified: Secondary | ICD-10-CM | POA: Insufficient documentation

## 2020-04-08 DIAGNOSIS — O0990 Supervision of high risk pregnancy, unspecified, unspecified trimester: Secondary | ICD-10-CM | POA: Insufficient documentation

## 2020-04-08 NOTE — Progress Notes (Signed)
I connected with  Harland Dingwall on 04/08/20 at  9:30 AM EDT by telephone and verified that I am speaking with the correct person using two identifiers.   I discussed the limitations, risks, security and privacy concerns of performing an evaluation and management service by telephone and the availability of in person appointments. I also discussed with the patient that there may be a patient responsible charge related to this service. The patient expressed understanding and agreed to proceed  I explained I am completing her New OB Intake today. We discussed Her EDD and that it is based on  sure LMP .She confirms she has only had spotting since last MAU visit.  I reviewed her allergies, meds, OB History, Medical /Surgical history, and appropriate screenings.She does have transportation issues and I placed resources into her AVS and informed her.  I informed her of [facility] services.  I explained I will send her the [employer] app and app was sent to her while on phone- she has not received while on phone and I asked her to check over the next few days and that we will follow up at her new ob visit.  I explained we will send a blood pressure cuff to [facility] once her medicaid is approved.  Explained  then we will have her take her blood pressure weekly and enter into the app. I explained she will have some visits in office and some virtually. She already has Community education officer. I reviewed her new ob  appointment date/ time with her , our location and to wear mask, no visitors.  I explained she will have a pelvic exam, ob bloodwork, hemoglobin a1C, cbg ,pap, and  genetic testing if desired,- she does want a panorama. I scheduled an [place] at 19 weeks and gave her the appointment. She voices understanding.   Baxter Gonzalez,RN 04/08/2020  9:44 AM

## 2020-04-08 NOTE — Patient Instructions (Signed)
If you are in need of transportation to get to and from your appointments in our office.  You can reach Transportation Services by calling [phone] Monday - Friday  7am-6pm.    Transportation Needs: Unmet Transportation Needs  . Lack of Transportation (Medical): Yes  . Lack of Transportation (Non-Medical): Yes

## 2020-04-22 ENCOUNTER — Encounter: Payer: Self-pay | Admitting: Obstetrics and Gynecology

## 2020-04-22 ENCOUNTER — Other Ambulatory Visit (HOSPITAL_COMMUNITY)
Admission: RE | Admit: 2020-04-22 | Discharge: 2020-04-22 | Disposition: A | Discharge status: Home / Self Care | Payer: Medicaid Other | Source: Ambulatory Visit | Attending: Obstetrics and Gynecology | Admitting: Obstetrics and Gynecology

## 2020-04-22 ENCOUNTER — Ambulatory Visit (INDEPENDENT_AMBULATORY_CARE_PROVIDER_SITE_OTHER): Payer: Medicaid Other | Admitting: Obstetrics and Gynecology

## 2020-04-22 ENCOUNTER — Other Ambulatory Visit: Payer: Self-pay

## 2020-04-22 VITALS — BP 137/81 | HR 90 | Wt 166.8 lb

## 2020-04-22 DIAGNOSIS — Z3A12 12 weeks gestation of pregnancy: Secondary | ICD-10-CM

## 2020-04-22 DIAGNOSIS — O99281 Endocrine, nutritional and metabolic diseases complicating pregnancy, first trimester: Secondary | ICD-10-CM

## 2020-04-22 DIAGNOSIS — O0990 Supervision of high risk pregnancy, unspecified, unspecified trimester: Secondary | ICD-10-CM | POA: Diagnosis not present

## 2020-04-22 DIAGNOSIS — E049 Nontoxic goiter, unspecified: Secondary | ICD-10-CM

## 2020-04-22 MED ORDER — BLOOD PRESSURE KIT DEVI
1.0000 | Freq: Every day | 0 refills | Status: DC
Start: 2020-04-22 — End: 2020-04-22

## 2020-04-22 MED ORDER — BLOOD PRESSURE KIT DEVI
1.0000 | Freq: Every day | 0 refills | Status: DC
Start: 2020-04-22 — End: 2020-09-14
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
## 2020-04-22 NOTE — Patient Instructions (Signed)
Transportation Resources [place] (GTA) [place], [place], [place] [address] https://www.[place].gov/departments/transportation/gdot-divisions/[place]-transit-agency-public-transportation-division     . Fixed-route bus services, including regional fare cards for PART, [place], [place], and WSTA buses.  . Reduced fare bus ID's available for Medicaid, Medicare, and "orange card" recipients.  Marland Kitchen SCAT offers curb-to-curb and door-to-door bus services for people with disabilities who are unable to use a fixed-bus route; also offers a shared-ride program.   Helpful tips:  -Routes available online and physical maps available at the main bus hub lobby (each for a specific route) -Smartphone directions often include bus routes (see the "bus" icon, next to the "car" and "walk" icons) -Routes differ on weekends, evenings and holidays, so plan ahead!  -If you have Medicaid, Medicare, or orange card, plan to obtain a reduced-fare ID to save 50% on rides. Check days and times to obtain an ID, and bring all necessary documents.   [employer] System [place]) [place], [place]. [address] [place], [place] [address] [phone] http://www.smith-bell.org/ **Fixed-bus route services, and demand response bus service for older adults  Department of Social [facility] [address], [place], [place] [address] [phone] www.ProfileWatcher.fi **Medicaid transportation is available to [facility] recipients who need assistance getting to [facility] medical appointments and providers  [facility] [address] [place] [address], [place], [place] [address] www.cjmedicaltransportation.com  ** Offers non-emergency transportation for medical appointments  Wheels [place], [place], [place] [address] [phone] www.wheels4hope.org **REFERRAL NEEDED by specific agencies (see website), after meeting specified criteria only  Mirant for [employer]) [address] [place], [place] [address] [phone]  BikerFestival.is  *Regional fixed-bus routes between counties (example: [place] to [place]) and [employer] of [place], [place], [place] [address] [phone] Http://senior-resources-guilford.org  Production assistant, radio available (call or see website for details)  [place] [address], [place], [place] [address] [phone] www.senioradults.org  **[place], Disability and [place] [address], [place], [place] [address] [phone] www.adtsrc.[place] Department of Health and [facility] [place], [place], [place] [address] [phone]  http://goodwin-walker.biz/  **Transportation expense assistance  Department of [place] [address] [place], [place], [place] [address] [phone] www.co.[place].us/pview.aspx?id=14850&catid=407  **Medicaid transportation for recipients who need assistance getting to [facility] medical appointments and providers      [place] [address], [place], [place] [address] [phone]   or  www.http://james-garner.info/ **SNAP/EBT/ Other nutritional benefits  [facility] [address], [place], [place] [address] [phone]  or  https://palmer-smith.com/ **WIC for  women who are pregnant and postpartum, infants and children up to 40 years old  [place] [address],  [employer] [place], [place] [address] [phone]   or   www.theblessedtable.org  **Food pantry  Brother Kolbe's [place], [place], [place] [address] [phone]   or   https://brotherkolbes.godaddysites.com  **Emergency food and prepared meals  [place] [address] [place],  [address] [phone]   or   www.cedargrovetop.us **Food pantry  [place] [facility]ry [address] [place],  [address] [phone]   or   www.https://hartman-jones.net/ **Food pantry  [employer] Hands Food Pantry [address] [place],  [address] (  [phone] **Food pantry  [facility] [address] [place], [place] [address] [phone]   or   www.greensborourbanministry.org  Insurance underwriter and prepared meals  [facility] Family Services-[place] [address] [place], [place], [place], [place] [address] DomainerFinder.be  **Food pantry  [place] [employer] Food Pantry [address] [place], [place] [address] [phone]   or   www.lbcnow.org  **Food pantry  One Step Further [address] [place], [place], [place] [address] [phone]   or   http://patterson-parker.net/ **Food pantry, nutrition education, gardening activities  [place] [address] [place], [place] [address] [phone] **Food pantry  [facility] [place] [address], [place], [place] [address] [phone]   or   www.salvationarmyofgreensboro.Lovette Cliche of [place], [place], [place] [address] [phone]   or   http://senior-resources-guilford.org [facility] on [place] [address], [place], [place] [address] [phone]   or   www.stmattchurch.com  **Food pantry  [place] [address] [place], [place] [address] [phone]   or   vandaliapresbyterianchurch.org **Food pantry  [place] Pantry [address] [place], [place], [place] [address] [phone]   or   www.Mormon101.be Food Civil Service fast streamer of [place] [address] Jacinto Reap [place], [place] [address] [phone] **Food pantry  Clark Resources   Department of [facility] [address] [place], [place] [address] [phone]   or   www.co.Mora.Cooper.us/ph/  [facility] [place]) [address], [place], [place] [address] [phone]   or   MedicationWebsites.com.au **WIC for pregnant and postpartum women, infants and children up to 93 years old  Compassionate Pantry [address], [place], [place] [address] [phone] **Food pantry  [place] [address], [place], [place] [address] [phone]   or   emerywoodbaptistchurch.com *Food pantry  Five loaves Two Fish Food Pantry [address], [place], [place] [address] [phone]   or   www.fcchighpoint.Radonna RickerFood pantry  Helping [facility] [address], [place], [place] [address] [phone]   or   http://www.green.com/ **Food pantry  [place] [address] [place], [place] [address] [phone]   or   www.facebook.com/KBCI1 Social worker of [place] [address] [place], [place] [address] [phone]   or   www.abbottscreek.org Ryder System of [place] [phone]   **Delivers meals  New Beginnings Full [facility] [address] [place], [place] [address] [phone]   or   nbfgm.sundaystreamwebsites.com  Programme researcher, broadcasting/film/video of [employer] [address] [place], [place] [address] [phone]   or   www.odm-hp.org  **Food pantry  [place] [address] [place], [place] [address] [phone]   or   R2live.tv **Food pantry  [place] [address], [place], [place] [address] [phone]   or   ClubMonetize.fr **Emergency food and pet food  Senior [place] [address] [place], [place] [address] [phone]   or   www.senioradults.org **Congregate and delivered meals to older adults  [place] Way of Greater [employer] [address] [place],  [address] [phone]   or  SamedayNews.com.cy **Back Pack Program for elementary school students  [place], [place], [place] [address] [phone]   or   www.wardstreetcommunityresources.org **Food pantry  [facility] [address] [place], [place] [address] [phone]   or   https://www.carlson.net/ **Emergency food, nutrition classes, food budgeting  Food Resources [place]  [address] [place], [place], [place] [address] [phone]  or   www.co.[place].us/pview.aspx?id=14850&catid=407 **SNAP/Other nutrition benefits  [place], [place], [place] [address] [phone]   or  http://goodwin-walker.biz/  **SNAP/Other nutrition benefits  [place], [place], [place] [address] [phone]  or  http://www.rockinghamcountypublichealth.org **WIC for pregnant and postpartum women, infants and children up to 23 years old  Aging, Disability and [place] [address] [place], [place] [address] [phone]  or www.risodejaneiro.com **Prepared meals for older adults  [place] [address], [place], [place] [address] [phone]  or  NetworkingMixer.com.ee **Food pantry  Hands of God [address] [place], [place] [address] [phone]   or   https://www.handsofgod.org/  Insurance underwriter  Men in [place] [address] [place], [place] [address] [phone] **Food pantry  [facility] for [place], [place], [place] [address] [phone]   or   www.ci.[place].us/government/parks_and_recreation/senior_center/index.php **Congregate meal for older adults  [facility] [address], [place], [place] [address] [phone]   or www.reidsvilleoutreachcenter.org  **Food pantry  [facility] [address] [place], [place] [address] [phone]   or   http://reid-chang.com/ **Food pantry  [place] [phone]) . [facility] Davy Pique, MD; Gwendlyn Deutscher, MD; Walker Kehr, MD; Andria Frames, MD; McDiarmid, MD; Dutch Quint, MD; Nori Riis, MD; Mingo Amber, [address] [place], [place] [address] o [phone] o Mon-Fri 8:30-12:30, 1:30-5:00 o Providers come to see babies at [facility] o Accepting Medicaid . [place] at [place] providers who accept newborns: Dorthy Cooler, MD; Orland Mustard, MD; Stephanie Acre, MD o [place], [place], [place] [address] o [phone] o Mon-Fri 8:00-5:30 o Babies seen by providers at [facility] o Does NOT accept Medicaid o Please call early in hospitalization for appointment (limited availability)  . Mustard Lampeter, MD o [address] [place], [place] [address] o [phone] o Mon, Tue, Thur, Fri 8:30-5:00, Wed 10:00-7:00 (closed 1-2pm) o Babies seen by [facility] providers o Accepting Medicaid . Chamberlayne, MD o [place], [place],  [address] o [phone] o Mon-Fri 8:30-5:00, Sat 8:30-12:00 o Provider comes to see babies at [facility] Medicaid o Must have been referred from current patients or contacted office prior to delivery . [place] [facility] ([place] for [place]) Franne Forts, MD; Tamera Punt, MD; Doneen Poisson, MD; Fatima Sanger, MD; Wynetta Emery, MD; Jess Barters, MD; Tami Ribas, MD; Herbert Moors, MD; Derrell Lolling, MD; Dorothyann Peng, MD; Lucious Groves, NP; Baldo Ash, NP o [place], [place],  [address] o [phone] o Mon, Tue, Thur, Fri 8:30-5:30, Wed 9:30-5:30, Sat 8:30-12:30 o Babies seen by  [facility] providers o Accepting Medicaid o Only accepting infants of first-time parents or siblings of current patients [facility] discharge coordinator will make follow-up appointment . Baltazar Najjar o  [address], [place], [place]  [address] o [phone]   Fax - [phone] . [facility] o [id number], [place], [place]  [address] o Phone - [phone]   Fax [phone] . [place], [place], [place], [place]  [address] o [phone]  East/[place]ortheast [place] [phone]) . [facility] Reginal Lutes, MD; Jacklynn Ganong, MD; Torrie Mayers, MD; MD; Rosana Hoes, MD; Servando Salina, MD; Rose Fillers, MD; Rex Kras, MD; Corinna Capra, MD; Volney American, MD; Trilby Drummer, MD; Janann Colonel, MD; Jimmye Norman, [place], [place], [place] [address] o [phone] o Mon-Fri 8:30-5:00 (extended evenings Mon-Thur as needed), Sat-Sun 10:00-1:00 o Providers come to see babies at [place] Medicaid for families of first-time babies and families with all children in the household age 52 and under. Must register with office prior to making appointment (M-F only). . [place], NP; Tomi Bamberger, MD; Redmond School, MD; [place], [place] [address] [place], [place] [address] o [phone] o Mon-Fri 8:00-5:00 o Babies seen by  providers at [facility] o Does NOT accept Medicaid/Commercial Insurance Only . [facility] at [place] ([facility])  Marnee Guarneri, MD; Drema Dallas, MD; Montine Circle, MD; Vilma Prader, MD; Vanita Panda, MD; Alfonso Ramus, MD; Ruthann Cancer, MD; Roxanne Mins, MD; Rosalva Ferron, MD; Polly Cobia, MD o [address] [place], [place] [address] o [phone] o Mon-Fri 8:30-5:30, Sat (Oct.-Mar.) 9:00-1:00 o Babies seen by providers at [place] [phone]) . [facility]lyn Peers, MD; Suzan Slick, MD o [place] [address], [place], [place] [address] o [phone] o Mon-Fri 8:30-5:00, Sat 8:30-12:00 o Providers come to see babies at [facility] o Does NOT accept Medicaid . [place] at [place], [place]; [place], MD; [place], [place]; Nancy Fetter, MD; Moreen Fowler, [place], [place], [place] [address] o [phone] o Mon-Fri 8:00-5:00 o Babies seen by providers at [facility] o Does NOT accept Medicaid o Only accepting babies of parents who are patients o Please call early in hospitalization for appointment (limited availability) . [facility] Blanca Friend, MD; Sharlene Motts, MD; Rod Can, MD; Warner Mccreedy, NP; Sabra Heck, MD; Ermalinda Memos, MD; Sharlett Iles, NP; Aurther Loft, MD; Jerrye Beavers, MD; Marcello Moores, MD; Berline Lopes, MD; Charolette Forward, MD o [address]. [place], [place], [place] [address] o [phone] o Mon-Fri 8:00-5:00, Sat 9:00-12:00 o Providers come to see babies at [facility] o Does NOT accept [facility] [phone]) . [place] at [place] providers accepting new patients: Dayna Ramus, NP; [place], [place], [place], [place] [address] o [phone] o Mon-Fri 8:00-5:00 o Babies seen by providers at [facility] o Does NOT accept Medicaid o Only accepting babies of parents who are patients o Please call early in hospitalization for appointment (limited  availability) . [facility] Oswaldo Conroy, MD; Sheran Lawless, MD o [address] [place], [place] [address] o [phone] (press 1 to schedule appointment) o Mon-Fri 8:00-5:00 o Providers come to see babies at [facility] o Does NOT accept Medicaid . [facility] Jodi Mourning, MD o [address], [place], [place] [address] o [phone] o Mon-Fri 8:30-5:00 (lunch 12:30-1:00), extended hours by appointment only Wed 5:00-6:30 o Babies seen by [facility] providers o Accepting Medicaid . [place] at Evalyn Casco, MD; [place], MD; Ethlyn Gallery, MD o [place], [place], [place] [address] o [phone] o Mon-Fri 8:00-5:00 o Babies seen by [facility] providers o Does NOT accept Medicaid .  Therapist, music at [place], MD; Yong Channel, MD; [place], [place] [address]., [place], [place] [address] o [phone] o Mon-Fri 8:00-5:00 o Babies seen by [facility] providers o Does NOT accept Medicaid . [place], [place]; [place], [place]; [place], NP; Albertina Parr, MD; Frederic Jericho, MD; Ronney Lion, MD; Carlos Levering, NP; Jerelene Redden, NP; Tomasita Crumble, NP; Ronelle Nigh, NP; Corinna Lines, MD; Olivarez, MD o [address]., [place], [place] [address] o [phone] o Mon-Fri 8:30-5:00, Sat 10:00-1:00 o Providers come to see babies at [facility] o Does NOT accept Medicaid o Free prenatal information session Tuesdays at 4:45pm . [facility] Porfirio Oar, MD; [place], [place]; [place], [place]; [place], [address]., [place] [address] o [phone] o Mon-Fri 7:30-5:30 o Babies seen by [facility] providers . [facility] Children's Doctor o [address] [place], [place], [place]  [address] o [phone]   Fax - [phone]  [place] [phone] & [phone]) . [place], MD o [address] [place], [place] [address] o [phone] o Mon-Thur 8:00-6:00 o Providers come to see babies at  [place] Medicaid . [place], NP; Melford Aase, MD; [place], [place]; [place], [place] [address] [place], [place] [address] o [phone] o Mon-Thur 7:30-7:30, Fri 7:30-4:30 o Babies seen by [facility] providers o Accepting Medicaid . [facility] Nyra Jabs, MD; Cristino Martes, NP; Gertie Baron, MD o [place], [place], [place] [address] o [phone] o Mon-Fri 8:30-5:00, Sat 8:30-12:00 o Providers come to see babies at [place] Medicaid o Must have "Meet & Greet" appointment at office prior to delivery . [place] ([place]) Jodene Nam, MD; Juleen China, MD; Clydene Laming, [place] [address], [place], [place] [address] o [phone] o Mon-Wed 8:00-6:00, Thur-Fri 8:00-5:00, Sat 9:00-12:00 o Providers come to see babies at [facility] o Does NOT accept Medicaid o Only accepting siblings of current patients . [facility] of [place], [place], [place], [place]  [address] o [phone]   Fax [phone] . [place] at [place] [address] [place], [place]  [address] o [phone]   Fax - [place] [phone] & [phone]) . Therapist, music at [place], DO; [place], [address] [place], [place] [address] o [phone] o Mon-Fri 7:00-5:00 o Babies seen by [facility] providers o Does NOT accept Medicaid . [place], MD; [place], [place]; [place], [place], [place], [place] [address] o [phone] o Mon-Fri 8:00-5:00 o Babies seen by [facility] providers o Accepting Medicaid . [place], MD; [place], [place]; [place], NP; [place], [place] Gustine Maunie, [place], [place]  [address] o [phone] o Mon-Fri 8:00-5:00 o Babies seen by providers at [facility] [place] [phone]) . [facility] at [place], [place] o [address] [place], [place] [address] o [phone] o Mon-Fri 8:00-5:00 o Babies seen by [facility] providers o Does NOT accept Medicaid o Limited availability, please call early in hospitalization to schedule follow-up . [facility] Leilani Merl, PA; Maisie Fus, MD; [place], MD; [place], [place]; Jeannine Kitten, MD; [place], [address] [place], [place],  [address] o [phone]  o Mon-Fri 8:30-5:00, Sat 9:00-12:00 o Babies seen by providers at [place] Medicaid o Please register online then schedule online or call office o [email or web address] . [place] ([place] at [place]) Kristian Covey, NP; Dwyane Dee, MD; Leonidas Romberg, PA o [address] [place], [place], [place] [address] o [phone] o Mon-Fri 8:00-5:00 o Babies seen by providers at [facility] o Accepting Medicaid . [place] ([place] [employer]) Dairl Ponder, MD; Rayvon Char, NP; Melina Modena, MD o [address] [place], [place], [place] [address] o [phone] o Mon-Fri 8:00-5:30, Sat&Sun by appointment (phones open at 8:30) o Babies seen by [facility] providers o Accepting Medicaid o Must be a first-time baby or sibling of current patient . [place], [id number], [place], [place]  [address] o [phone]   Fax - [phone]  [place] [phone] & [phone]) . [place], [place]; [place], [place]; Benjamine Mola, MD; [place], [place]; Harrell Lark, MD o [address], [place], [place] [address] o [phone] o Mon-Thur 8:00-7:00, Fri 8:00-5:00, Sat 8:00-12:00, Sun 9:00-12:00 o Babies seen by [facility] providers o Accepting Medicaid . [facility] - Family Medicine at [facility], MD; Ruthann Cancer, MD; [facility], MD o [address], [place], [place] [address] o [phone] o Mon-Thur 8:00-5:00 o Babies seen by providers at [facility] o Accepting Medicaid . [facility] at [place], MD; Coe-Goins, MD; Amedeo Plenty, MD; Bobby Rumpf, MD; List, MD; Lavonia Drafts, MD; Ruthann Cancer, MD; Selinda Eon, MD; Audie Box, MD; Jim Like, MD; Christie Nottingham, MD; Hubbard Hartshorn, MD; Modena Nunnery, MD o [address] [place], [place] [address] o [phone] o Mon-Fri 8:00-5:30, Sat (Oct.-Mar.) 9:00-1:00 o Babies seen by providers at [facility] o Accepting Medicaid o Must fill out new patient packet, available online at [email or web address] . [place] ([facility]) Barnabas Lister, NP; Kenton Kingfisher, NP; Claiborne Billings, NP; Rolla Plate, MD; [place], [place]; Carola Rhine, MD; Tyron Russell, MD; Delia Chimes, NP o [address], [place], [place] [address] o [phone] o Mon-Thur 8:00-5:30, Fri 8:00-5:00 o Babies seen by providers at [place] [phone]) . [place], [place]; [place], MD; Dennard Schaumann, MD; [place], [place] o [address] [place], [place] [address] o [phone] o Mon-Fri 8:00-5:00 o Babies seen by providers at [facility] [phone]) . [place] at [place], [place]; Olen Pel, MD; [place], [place], [place], [place] [address] o [phone] o Mon-Fri 8:00-5:00 o Babies seen by providers at [facility] o Does NOT accept Medicaid o Limited appointment availability, please call early in hospitalization  . Therapist, music at [place], [place]; [place], [address] [place], [place] [address] o [phone] o Mon-Fri 8:00-5:00 o Babies seen by [facility] providers o Does NOT accept Medicaid . [facility]  - [facility] Su Grand, MD; Guy Sandifer, MD; [place], [place]; [place], [place] [address], [place], [place] [address] o [phone] o Mon-Fri 8:00-5:00 o After hours clinic [facility] [place],  [address]) [phone] Mon-Fri 5:00-8:00, Sat 12:00-6:00, Sun 10:00-4:00 o Babies seen by [facility] providers o Accepting Medicaid . [place] at [place]  [place] [address], [place], [place]  [address] o [phone]   Fax - [phone]  [place] [phone]) . Therapist, music at [facility], MD o [address] [place], [place], [place] [address] o [phone] o Mon-Fri 8:00-5:00 o Babies seen by [facility] providers o Does NOT accept Medicaid . [place] ([place] at [facility]) Bing Neighbors, MD o [address] [place], [place], [place] [address] o [phone] o Mon-Thur 8:00-7:00, Fri 8:00-5:00, Sat 8:00-12:00 o Babies seen by providers at [facility] o Accepting Medicaid - but does not have vaccinations in office (must be received elsewhere) o Limited availability, please call early in hospitalization  [place] ([address]) . [place], [place], [place] [address] o [phone]  Fax [phone]     **BP Cuff is located at [place].

## 2020-04-22 NOTE — Progress Notes (Signed)
Ordered BP Cuff

## 2020-04-23 ENCOUNTER — Encounter: Payer: Self-pay | Admitting: *Deleted

## 2020-04-23 LAB — OBSTETRIC PANEL, INCLUDING HIV
Antibody Screen: NEGATIVE
Basophils Absolute: 0 10*3/uL (ref 0.0–0.2)
Basos: 0 %
EOS (ABSOLUTE): 0 10*3/uL (ref 0.0–0.4)
Eos: 0 %
HIV Screen 4th Generation wRfx: NONREACTIVE
Hematocrit: 36.7 % (ref 34.0–46.6)
Hemoglobin: 11.7 g/dL (ref 11.1–15.9)
Hepatitis B Surface Ag: NEGATIVE
Immature Grans (Abs): 0 10*3/uL (ref 0.0–0.1)
Immature Granulocytes: 0 %
Lymphocytes Absolute: 1 10*3/uL (ref 0.7–3.1)
Lymphs: 15 %
MCH: 26.3 pg — ABNORMAL LOW (ref 26.6–33.0)
MCHC: 31.9 g/dL (ref 31.5–35.7)
MCV: 83 fL (ref 79–97)
Monocytes Absolute: 0.7 10*3/uL (ref 0.1–0.9)
Monocytes: 11 %
Neutrophils Absolute: 4.9 10*3/uL (ref 1.4–7.0)
Neutrophils: 74 %
Platelets: 257 10*3/uL (ref 150–450)
RBC: 4.45 x10E6/uL (ref 3.77–5.28)
RDW: 14.6 % (ref 11.7–15.4)
RPR Ser Ql: NONREACTIVE
Rh Factor: POSITIVE
Rubella Antibodies, IGG: 1.09 index (ref >0.99)
WBC: 6.7 10*3/uL (ref 3.4–10.8)

## 2020-04-23 LAB — HEMOGLOBIN A1C
Est. average glucose Bld gHb Est-mCnc: 111 mg/dL
Hgb A1c MFr Bld: 5.5 % (ref 4.8–5.6)

## 2020-04-23 LAB — TSH: TSH: 0.507 u[IU]/mL (ref 0.450–4.500)

## 2020-04-23 LAB — HEPATITIS C ANTIBODY: Hep C Virus Ab: <0.1 s/co ratio (ref 0.0–0.9)

## 2020-04-23 MED ORDER — ASPIRIN 81 MG PO CHEW: 81.0000 mg | CHEWABLE_TABLET | Freq: Every day | ORAL | 1 refills | Status: DC

## 2020-04-24 LAB — URINE CULTURE, OB REFLEX

## 2020-04-24 LAB — CULTURE, OB URINE

## 2020-04-28 LAB — CYTOLOGY - PAP
Chlamydia: NEGATIVE
Comment: NEGATIVE
Comment: NEGATIVE
Comment: NORMAL
Diagnosis: UNDETERMINED — AB
High risk HPV: NEGATIVE
Neisseria Gonorrhea: NEGATIVE

## 2020-05-12 ENCOUNTER — Telehealth (INDEPENDENT_AMBULATORY_CARE_PROVIDER_SITE_OTHER): Payer: Medicaid Other

## 2020-05-12 DIAGNOSIS — O0990 Supervision of high risk pregnancy, unspecified, unspecified trimester: Secondary | ICD-10-CM

## 2020-05-12 NOTE — Telephone Encounter (Signed)
Called pt with [employer] genetic screening results. Pt tested positive for an increased carrier risk for spinal muscular atrophy. Results given. Number given to pt to schedule genetic counseling appt with [facility].

## 2020-05-13 ENCOUNTER — Encounter: Payer: Self-pay | Admitting: *Deleted

## 2020-05-20 ENCOUNTER — Encounter: Payer: Self-pay | Admitting: Obstetrics and Gynecology

## 2020-05-20 ENCOUNTER — Encounter (INDEPENDENT_AMBULATORY_CARE_PROVIDER_SITE_OTHER): Payer: Medicaid Other | Admitting: Obstetrics and Gynecology

## 2020-05-20 NOTE — Progress Notes (Signed)
0958-- attempted to call patient to check in for appt, no answer- unable to leave a message as voicemail was full.

## 2020-05-20 NOTE — Progress Notes (Signed)
I connected with  Ana Moore on 05/20/20 at 10:15 AM EDT by telephone and verified that I am speaking with the correct person using two identifiers.   I discussed the limitations, risks, security and privacy concerns of performing an evaluation and management service by telephone and the availability of in person appointments. I also discussed with the patient that there may be a patient responsible charge related to this service. The patient expressed understanding and agreed to proceed.  Bethanne Ginger, CMA 05/20/2020  10:17 AM

## 2020-05-21 NOTE — Progress Notes (Signed)
Patient did not keep her appt today.   Noni Saupe I, NP 05/21/2020 10:10 AM

## 2020-06-03 ENCOUNTER — Encounter: Payer: Self-pay | Admitting: General Practice

## 2020-06-10 ENCOUNTER — Other Ambulatory Visit: Payer: Self-pay | Admitting: *Deleted

## 2020-06-10 ENCOUNTER — Other Ambulatory Visit: Payer: Self-pay

## 2020-06-10 ENCOUNTER — Ambulatory Visit: Payer: Medicaid Other | Attending: Obstetrics and Gynecology

## 2020-06-10 ENCOUNTER — Ambulatory Visit: Payer: Medicaid Other | Admitting: *Deleted

## 2020-06-10 DIAGNOSIS — D219 Benign neoplasm of connective and other soft tissue, unspecified: Secondary | ICD-10-CM

## 2020-06-10 DIAGNOSIS — N83209 Unspecified ovarian cyst, unspecified side: Secondary | ICD-10-CM | POA: Diagnosis present

## 2020-06-10 DIAGNOSIS — O0990 Supervision of high risk pregnancy, unspecified, unspecified trimester: Secondary | ICD-10-CM | POA: Diagnosis present

## 2020-06-10 DIAGNOSIS — O99210 Obesity complicating pregnancy, unspecified trimester: Secondary | ICD-10-CM

## 2020-06-10 DIAGNOSIS — O3412 Maternal care for benign tumor of corpus uteri, second trimester: Secondary | ICD-10-CM

## 2020-06-10 DIAGNOSIS — D259 Leiomyoma of uterus, unspecified: Secondary | ICD-10-CM

## 2020-06-10 DIAGNOSIS — E049 Nontoxic goiter, unspecified: Secondary | ICD-10-CM | POA: Insufficient documentation

## 2020-06-10 DIAGNOSIS — Z148 Genetic carrier of other disease: Secondary | ICD-10-CM

## 2020-06-10 DIAGNOSIS — Z3A19 19 weeks gestation of pregnancy: Secondary | ICD-10-CM

## 2020-06-10 DIAGNOSIS — Z362 Encounter for other antenatal screening follow-up: Secondary | ICD-10-CM

## 2020-06-10 DIAGNOSIS — Z363 Encounter for antenatal screening for malformations: Secondary | ICD-10-CM

## 2020-06-17 ENCOUNTER — Encounter: Payer: Medicaid Other | Admitting: Obstetrics and Gynecology

## 2020-06-17 NOTE — Progress Notes (Signed)
Patient did not keep her appt today.

## 2020-06-17 NOTE — Progress Notes (Signed)
906-- Called patient to check in for appt, no answer- left message to call us back or get on mychart video to begin visit.   933-- Called patient to check in for appt, no answer- left message to call us back to reschedule missed appt.

## 2020-06-24 ENCOUNTER — Other Ambulatory Visit: Payer: Self-pay

## 2020-06-24 ENCOUNTER — Ambulatory Visit: Payer: Self-pay | Admitting: Genetic Counselor

## 2020-06-24 ENCOUNTER — Ambulatory Visit: Payer: Medicaid Other | Attending: Obstetrics and Gynecology | Admitting: Genetic Counselor

## 2020-06-24 DIAGNOSIS — Z7183 Encounter for nonprocreative genetic counseling: Secondary | ICD-10-CM

## 2020-06-24 DIAGNOSIS — Z148 Genetic carrier of other disease: Secondary | ICD-10-CM | POA: Diagnosis not present

## 2020-06-24 DIAGNOSIS — Z315 Encounter for genetic counseling: Secondary | ICD-10-CM

## 2020-06-24 NOTE — Progress Notes (Signed)
06/24/2020  MURANDA COYE May 31, 1986 MRN: [id number] DOV: 06/24/2020  Ms. 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 Ms. Ferrebee was accompanied to her appointment by her partner, Laverle Hobby III.   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 Bryant is a [id number], 34 y.o. female. Her current pregnancy has completed [redacted]w[redacted]d(Estimated Date of Delivery: 11/03/20). Ms. JKoblerhad a miscarriage with a prior partner. The current pregnancy is the first for this couple.  Ms. JLeamerdenied exposure to environmental toxins or chemical agents. She denied the use of alcohol, tobacco or street drugs. She reported taking Tylenol. She denied significant viral illnesses, fevers, and bleeding during the course of her pregnancy. She has a history of uterine fibroids. Her medical and surgical histories were otherwise noncontributory.  Family History  A three generation pedigree was drafted and reviewed. Both family histories were reviewed and found to be noncontributory for birth defects, intellectual disability, recurrent pregnancy loss, and known genetic conditions. Ms. JGaccionepartner preferred not to share details about his family history information today, citing complicated social situations. For this reason, risk assessment was limited.  The patient's ethnicity is African American. The father of the pregnancy's ethnicity is African American. Ashkenazi Jewish ancestry and consanguinity were denied. Pedigree will be scanned under Media.  Discussion  Spinal muscular atrophy:  Ms. JStarkmanhad carrier screening for spinal muscular atrophy (SMA) performed through the laboratory [facility]. Based on the results of this screen, Ms. JNearywas found to have 2 copies of the SMN1 gene; however, she also has the  c.*3+80T>G polymorphism of SMN1 in intron 7 (also known as [id number]). This puts her at increased risk (1 in 349 to be a silent 2+0 carrier for SMA. SMA is a condition caused by mutations in the SMN1 gene. Typically, individuals have two copies of the SMN1 gene, with one copy present on each chromosome. In SMA silent carriers, both copies of the SMN1 gene gene are present on one chromosome, with no copies of SMN1 present on the other chromosome.    SMA is characterized by progressive muscle weakness and atrophy due to degeneration and loss of anterior horn cells (lower motor neurons) in the spinal cord and brain stem. We discussed the different types of SMA (0, I, II, and III), including differences in severity and age of onset. We also reviewed the autosomal recessive inheritance pattern of SMA. We discussed that the couple only has a chance of having a child with SMA if both parents are identified as carriers for the condition. Based on the carrier frequency for SMA in the African American population, Ms. JShetterlypartner currently has a 1 in 664chance of being a carrier of SMA. Without partner screening to refine risk and based on her partner's ethnicity, the couple currently has a 1 in 8976 (0.01%) risk of having a child with SMA. If Ms. JBuseypartner were found to have 2 copies of SMN1, his risk of being a carrier is reduced but not eliminated. However, if both parents are carriers of SMA, there is a 1 in 4 (25%) chance of having an affected fetus. We discussed that carrier screening for SMA is recommended for Ms. Greenley's partner to refine their risk of having an affected child. Ms. JLairand her partner indicated that they are interested in pursuing partner carrier screening.  Other carrier screening results:  Ms. Zbikowski carrier screening was negative for the other 13 conditions screened. Thus, her risk to be a carrier for these additional conditions (listed separately in the laboratory  report) has been reduced but not eliminated. This also significantly reduces her risk of having a child affected by one of these conditions.   Aneuploidy screening results:  We also reviewed that Ms. Brissett had Panorama NIPS through the laboratory [facility] that was low-risk for fetal aneuploidies. We reviewed that these results showed a less than 1 in 10,000 risk for trisomies 21, 18 and 13, and monosomy X (Turner syndrome).  In addition, the risk for triploidy and sex chromosome trisomies (47,XXX and 47,XXY) was also low. Ms. Riga elected to have cfDNA analysis for 22q11.2 deletion syndrome, which was also low risk (1 in 2900). We reviewed that while this testing identifies 94-99% of pregnancies with trisomy 74, trisomy 31, trisomy 22, sex chromosome aneuploidies, and triploidy, it is NOT diagnostic. A positive test result requires confirmation by CVS or amniocentesis, and a negative test result does not rule out a fetal chromosome abnormality. She also understands that this testing does not identify all genetic conditions.  Diagnostic testing:  Ms. Kathman was also counseled regarding diagnostic testing via amniocentesis. We discussed the technical aspects of the procedure and quoted up to a 1 in 500 (0.2%) risk for spontaneous pregnancy loss or other adverse pregnancy outcomes as a result of amniocentesis. Cultured cells from an amniocentesis sample allow for the visualization of a fetal karyotype, which can detect >99% of chromosomal aberrations. Chromosomal microarray can also be performed to identify smaller deletions or duplications of fetal chromosomal material. Amniocentesis could also be performed to assess whether the baby is affected by SMA. After careful consideration, Ms. Garmon declined amniocentesis at this time. She understands that amniocentesis is available at any point after 16 weeks of pregnancy and that she may opt to undergo the procedure at a later date should she change her  mind.  Plan:  Ms. Jernberg and her partner, Laverle Hobby III, were interested in pursuing partner carrier screening for SMA. However, Mr. Hebert Soho did not have a copy of his insurance card with him today. We made a plan for him to email me a photo of his insurance card so that I can perform a benefits investigation to estimate the couple's out of pocket cost for testing. We discussed that the laboratory [facility] offers testing for $250 out of pocket if pursuing testing through insurance is expected to cost more than that. I gave the couple a saliva kit to take home. Once I receive Mr. Morton Amy insurance information, I will begin the benefits investigation. I will then contact the couple once the benefits investigation is complete to facilitate sample collection and testing from there if still desired.  Of note, Ms. Vaillancourt was tearful today with concerns about the baby's heart. When she read her ultrasound report and saw that the four-chamber view of the heart could not be obtained, she was concerned that this meant that the baby has a heart defect. I informed her that clear views of every part of the baby's anatomy are necessary to identify birth defects. One view of the baby's heart was unable to be visualized on her last ultrasound. This does not indicate that the baby has a congenital heart defect, as other factors such as fetal positioning, movement, etc all contribute to how well the fetal anatomy can be seen. Ms. Rothbauer will return for a  follow-up ultrasound in a few weeks to get another look at the baby's heart. Hopefully the four-chamber view of the heart can be cleared at that time.  I counseled Ms. Kressin regarding the above risks and available options. The approximate face-to-face time with the genetic counselor was 40 minutes.  In summary:  Discussed carrier screening results and options for follow-up testing  Increased risk (1 in 34, ~3%) to be silent carrier for spinal muscular  atrophy  Desires partner carrier screening. Gave the couple a saliva kit. Partner will email me photo of insurance card for benefits investigation. I will facilitate testing from there  Reviewed low-risk NIPS result  Reduction in risk for Down syndrome, trisomy 37, trisomy 75, triploidy, sex chromosome aneuploidies, and 22q11.2 deletion syndrome  Offered additional testing and screening  Declined amniocentesis  Reviewed family history concerns   Buelah Manis, MS, Counselling psychologist

## 2020-06-29 ENCOUNTER — Telehealth: Payer: Self-pay | Admitting: Medical

## 2020-06-29 NOTE — Telephone Encounter (Signed)
Patient called ultrasound asking if she could have her wisdom teeth removed because they have been causing her a lot of pain. Informed Ebony Hail to let the patient know that I would send a message to the nurses and they will contact her back as soon as they could. Ebony Hail verbalized understanding and message sent to clinical pool.

## 2020-07-02 ENCOUNTER — Ambulatory Visit (INDEPENDENT_AMBULATORY_CARE_PROVIDER_SITE_OTHER): Payer: Medicaid Other | Admitting: Medical

## 2020-07-02 ENCOUNTER — Encounter: Payer: Self-pay | Admitting: Medical

## 2020-07-02 ENCOUNTER — Other Ambulatory Visit: Payer: Self-pay

## 2020-07-02 VITALS — BP 131/62 | HR 84 | Wt 169.0 lb

## 2020-07-02 DIAGNOSIS — O0990 Supervision of high risk pregnancy, unspecified, unspecified trimester: Secondary | ICD-10-CM

## 2020-07-02 DIAGNOSIS — O99210 Obesity complicating pregnancy, unspecified trimester: Secondary | ICD-10-CM

## 2020-07-02 DIAGNOSIS — D219 Benign neoplasm of connective and other soft tissue, unspecified: Secondary | ICD-10-CM

## 2020-07-02 DIAGNOSIS — E049 Nontoxic goiter, unspecified: Secondary | ICD-10-CM

## 2020-07-02 NOTE — Patient Instructions (Addendum)
Monitor your blood pressure daily and record  Second Trimester of Pregnancy  The second trimester is from week 14 through week 27 (month 4 through 6). This is often the time in pregnancy that you feel your best. Often times, morning sickness has lessened or quit. You may have more energy, and you may get hungry more often. Your unborn baby is growing rapidly. At the end of the sixth month, he or she is about 9 inches long and weighs about 1 pounds. You will likely feel the baby move between 18 and 20 weeks of pregnancy. Follow these instructions at home: Medicines  Take over-the-counter and prescription medicines only as told by your doctor. Some medicines are safe and some medicines are not safe during pregnancy.  Take a prenatal vitamin that contains at least 600 micrograms (mcg) of folic acid.  If you have trouble pooping (constipation), take medicine that will make your stool soft (stool softener) if your doctor approves. Eating and drinking   Eat regular, healthy meals.  Avoid raw meat and uncooked cheese.  If you get low calcium from the food you eat, talk to your doctor about taking a daily calcium supplement.  Avoid foods that are high in fat and sugars, such as fried and sweet foods.  If you feel sick to your stomach (nauseous) or throw up (vomit): ? Eat 4 or 5 small meals a day instead of 3 large meals. ? Try eating a few soda crackers. ? Drink liquids between meals instead of during meals.  To prevent constipation: ? Eat foods that are high in fiber, like fresh fruits and vegetables, whole grains, and beans. ? Drink enough fluids to keep your pee (urine) clear or pale yellow. Activity  Exercise only as told by your doctor. Stop exercising if you start to have cramps.  Do not exercise if it is too hot, too humid, or if you are in a place of great height (high altitude).  Avoid heavy lifting.  Wear low-heeled shoes. Sit and stand up straight.  You can continue to  have sex unless your doctor tells you not to. Relieving pain and discomfort  Wear a good support bra if your breasts are tender.  Take warm water baths (sitz baths) to soothe pain or discomfort caused by hemorrhoids. Use hemorrhoid cream if your doctor approves.  Rest with your legs raised if you have leg cramps or low back pain.  If you develop puffy, bulging veins (varicose veins) in your legs: ? Wear support hose or compression stockings as told by your doctor. ? Raise (elevate) your feet for 15 minutes, 3-4 times a day. ? Limit salt in your food. Prenatal care  Write down your questions. Take them to your prenatal visits.  Keep all your prenatal visits as told by your doctor. This is important. Safety  Wear your seat belt when driving.  Make a list of emergency phone numbers, including numbers for family, friends, the hospital, and police and fire departments. General instructions  Ask your doctor about the right foods to eat or for help finding a counselor, if you need these services.  Ask your doctor about local prenatal classes. Begin classes before month 6 of your pregnancy.  Do not use hot tubs, steam rooms, or saunas.  Do not douche or use tampons or scented sanitary pads.  Do not cross your legs for long periods of time.  Visit your dentist if you have not done so. Use a soft toothbrush to brush your  teeth. Floss gently.  Avoid all smoking, herbs, and alcohol. Avoid drugs that are not approved by your doctor.  Do not use any products that contain nicotine or tobacco, such as cigarettes and e-cigarettes. If you need help quitting, ask your doctor.  Avoid cat litter boxes and soil used by cats. These carry germs that can cause birth defects in the baby and can cause a loss of your baby (miscarriage) or stillbirth. Contact a doctor if:  You have mild cramps or pressure in your lower belly.  You have pain when you pee (urinate).  You have bad smelling fluid  coming from your vagina.  You continue to feel sick to your stomach (nauseous), throw up (vomit), or have watery poop (diarrhea).  You have a nagging pain in your belly area.  You feel dizzy. Get help right away if:  You have a fever.  You are leaking fluid from your vagina.  You have spotting or bleeding from your vagina.  You have severe belly cramping or pain.  You lose or gain weight rapidly.  You have trouble catching your breath and have chest pain.  You notice sudden or extreme puffiness (swelling) of your face, hands, ankles, feet, or legs.  You have not felt the baby move in over an hour.  You have severe headaches that do not go away when you take medicine.  You have trouble seeing. Summary  The second trimester is from week 14 through week 27 (months 4 through 6). This is often the time in pregnancy that you feel your best.  To take care of yourself and your unborn baby, you will need to eat healthy meals, take medicines only if your doctor tells you to do so, and do activities that are safe for you and your baby.  Call your doctor if you get sick or if you notice anything unusual about your pregnancy. Also, call your doctor if you need help with the right food to eat, or if you want to know what activities are safe for you. This information is not intended to replace advice given to you by your health care provider. Make sure you discuss any questions you have with your health care provider. Document Revised: 03/14/2019 Document Reviewed: 12/26/2016 Elsevier Patient Education  2020 Frederic [phone]) . [facility] Davy Pique, MD; Gwendlyn Deutscher, MD; Walker Kehr, MD; Andria Frames, MD; McDiarmid, MD; Dutch Quint, MD; Nori Riis, MD; Mingo Amber, [address] [place], [place] [address] o [phone] o Mon-Fri 8:30-12:30, 1:30-5:00 o Providers come to see babies at [facility] o Accepting Medicaid . [place] [facility] [place] providers who accept newborns: Dorthy Cooler, MD; Orland Mustard, MD; Stephanie Acre, MD o [place], [place], [place] [address] o [phone] o Mon-Fri 8:00-5:30 o Babies seen by providers at [facility] o Does NOT accept Medicaid o Please call early in hospitalization for appointment (limited availability)  . Mustard Wayne, MD o [address] [place], [place] [address] o [phone] o Mon, Tue, Thur, Fri 8:30-5:00, Wed 10:00-7:00 (closed 1-2pm) o Babies seen by [facility] providers o Accepting Medicaid . [place], MD o [place], [place], [place] [address] o [phone] o Mon-Fri 8:30-5:00, Sat 8:30-12:00 o Provider comes to see babies at [place] Medicaid o Must have been referred from current patients or contacted office prior to delivery . [facility] ([place] for [place]) Franne Forts, MD; Tamera Punt, MD; Doneen Poisson, MD; Fatima Sanger, MD; Wynetta Emery, MD; Jess Barters, MD; Tami Ribas, MD; Herbert Moors, MD; Derrell Lolling, MD; Dorothyann Peng, MD; Lucious Groves, NP; Baldo Ash, NP o [place], [place], [place] [address] o [phone] o Mon, Tue, Thur, Fri 8:30-5:30, Wed 9:30-5:30, Sat 8:30-12:30 o Babies seen by [facility] providers o Accepting Medicaid o Only accepting infants of first-time parents or siblings of current patients [facility] discharge coordinator will make follow-up appointment . Baltazar Najjar o [place] [address] [place], [place]  [address] o [phone]   Fax - [phone] . [facility] o [address], [place], [place]  [address] o Phone - [phone]   Fax [phone] . [place], [place], [place], [place]  [address] o [phone]  [place] [phone]) . [facility] Reginal Lutes, MD; Jacklynn Ganong,  MD; Torrie Mayers, MD; MD; Rosana Hoes, MD; Servando Salina, MD; Rose Fillers, MD; Rex Kras, MD; Corinna Capra, MD; Volney American, MD; Trilby Drummer, MD; Janann Colonel, MD; Jimmye Norman, [place], [place], [place] [address] o [phone] o Mon-Fri 8:30-5:00 (extended evenings Mon-Thur as needed), Sat-Sun 10:00-1:00 o Providers come to see babies at [place] Medicaid for families of first-time babies and families with all children in the household age 74 and under. Must register with office prior to making appointment (M-F only). . [place], NP; Tomi Bamberger, MD; Redmond School, MD; [place], [place] [address]., [place], [place] [address] o [phone] o Mon-Fri 8:00-5:00 o Babies seen by providers at [facility] o Does NOT accept Medicaid/Commercial Insurance Only . [facility] [place] ([facility])  Marnee Guarneri, MD; Drema [place], MD; Montine Circle, MD; Vilma Prader, MD; Vanita Panda, MD; Alfonso Ramus, MD; Ruthann Cancer, MD; Roxanne Mins, MD; Rosalva Ferron, MD; Polly Cobia, MD o [place]., [place], [place] [address] o [phone] o Mon-Fri 8:30-5:30, Sat (Oct.-Mar.) 9:00-1:00 o Babies seen by providers at [place] [phone]) . [facility] Peers, MD; Suzan Slick, MD o [address], [place], [place] [address] o [phone] o Mon-Fri 8:30-5:00, Sat 8:30-12:00 o Providers come to see babies at [facility] o Does NOT accept Medicaid . [place] at [place], [place]; [place], MD; [place], [place]; Nancy Fetter, MD; Moreen [place], [place], [place], [place] [address] o [phone] o Mon-Fri 8:00-5:00 o Babies seen by providers at [facility] o Does NOT accept Medicaid o Only accepting babies of parents who are patients o Please call early in hospitalization for appointment (limited availability) . [facility] Blanca Friend, MD; Sharlene Motts, MD; Rod Can, MD; Warner Mccreedy, NP; Sabra Heck, MD; Ermalinda Memos, MD;  Sharlett Iles, NP; Aurther Loft, MD; Jerrye Beavers, MD; Marcello Moores, MD; Berline Lopes, MD; Charolette Forward, MD o [place]. [place], [place], [place] [address] o [phone] o Mon-Fri 8:00-5:00, Sat 9:00-12:00 o Providers come to see babies at [facility] o Does NOT accept [facility] [phone]) . [place] at [place] providers accepting new patients: Dayna Ramus, NP; [place], [place], [place], [place] [address] o [phone] o Mon-Fri 8:00-5:00 o Babies seen by providers at [facility] o Does NOT accept Medicaid o Only accepting babies of parents who are patients o Please call early in hospitalization for appointment (limited availability) . [facility] Oswaldo Conroy, MD; Sheran Lawless, MD o [place] [place], [place] [address] o [phone] (press 1 to schedule  appointment) o Mon-Fri 8:00-5:00 o Providers come to see babies at [facility] o Does NOT accept Medicaid . [facility] Jodi Mourning, MD o [address] [place], [place] [address] o [phone] o Mon-Fri 8:30-5:00 (lunch 12:30-1:00), extended hours by appointment only Wed 5:00-6:30 o Babies seen by [facility] providers o Accepting Medicaid . [place] at Evalyn Casco, MD; [place], MD; Ethlyn Gallery, MD o [place], [place], [place] [address] o [phone] o Mon-Fri 8:00-5:00 o Babies seen by [facility] providers o Does NOT accept Medicaid . Therapist, music at [place], MD; Yong Channel, MD; [place], [place]., [place], [place] [address] o [phone] o Mon-Fri 8:00-5:00 o Babies seen by [facility] providers o Does NOT accept Medicaid . [place], [place]; [place], [place]; [place], NP; Albertina Parr, MD; Frederic Jericho, MD; Ronney Lion, MD; Carlos Levering, NP; Jerelene [place], NP; Tomasita Crumble, NP; Ronelle Nigh, NP; Corinna Lines, MD; [place], MD o [address] [place], [place] [address] o [phone] o Mon-Fri 8:30-5:00, Sat  10:00-1:00 o Providers come to see babies at [facility] o Does NOT accept Medicaid o Free prenatal information session Tuesdays at 4:45pm . [facility] Porfirio Oar, MD; [place], [place]; [place], [place]; [place], [place]., [place] [address] o [phone] o Mon-Fri 7:30-5:30 o Babies seen by [facility] providers . [facility] Doctor o [address], [place], [place], [place]  [address] o [phone]   Fax - [phone]  [place] [phone] & [phone]) . [place], MD o [address] [place], [place] [address] o [phone] o Mon-Thur 8:00-6:00 o Providers come to see babies at [place] Medicaid . [place], NP; Melford Aase, MD; [place], [place]; [place], [place]., [place], [place] [address] o [phone] o Mon-Thur 7:30-7:30, Fri 7:30-4:30 o Babies seen by [facility] providers o Accepting Medicaid . [facility] Nyra Jabs, MD; Cristino Martes, NP; Gertie Baron, MD o [place] [address], [place], [place] [address] o [phone] o Mon-Fri 8:30-5:00, Sat 8:30-12:00 o Providers come to see babies at [place] Medicaid o Must have "Meet & Greet" appointment at office prior to delivery . [place] ([place]) Jodene Nam, MD; Juleen China, MD; Clydene Laming, [place] [address], [place], [place] [address] o [phone] o Mon-Wed 8:00-6:00, Thur-Fri 8:00-5:00, Sat 9:00-12:00 o Providers come to see babies at [facility] o Does NOT accept Medicaid o Only accepting siblings of current patients . [facility], [place], [place], [place]  [address] o [phone]   Fax [phone] . [place] at [place] [address] [place], [place]  [address] o [phone]   Fax -  [place] [phone] & [phone]) . Therapist, music at [place], DO; [place], [place]., [place], [place] [address] o [phone] o Mon-Fri 7:00-5:00 o Babies seen by [facility] providers o Does NOT accept Medicaid . [place], MD; [place], [place]; [place], [place], [place], [place] [address] o [phone] o Mon-Fri 8:00-5:00 o Babies seen by [facility] providers o Accepting Medicaid . [place], MD; [place], [place]; [place], NP; [place], [place], [place], [place] [address] o [phone] o Mon-Fri 8:00-5:00 o  Babies seen by providers at [place] [phone]) . [facility] at [place], [place] o [place]., [place], [place] [address] o [phone] o Mon-Fri 8:00-5:00 o Babies seen by [facility] providers o Does NOT accept Medicaid o Limited availability, please call early in hospitalization to schedule follow-up . [facility] Leilani Merl, PA; Maisie Fus, MD; [place], MD; [place], [place]; Jeannine Kitten, MD; [place], [place] [facility] [address], [place], [place] [address] o [phone] o Mon-Fri 8:30-5:00, Sat 9:00-12:00 o Babies seen by providers at [facility] o Accepting Medicaid o Please register online then schedule online or call office o [email or web address] . [place] ([place] at [place]) Kristian Covey, NP; Dwyane Dee, MD; Leonidas Romberg, PA o [address] [place], [place], [place] [address] o [phone] o Mon-Fri 8:00-5:00 o Babies seen by providers at [facility] o Accepting Medicaid . [place] ([facility] [employer]) Dairl Ponder, MD; Rayvon Char, NP; Melina Modena, MD o [address] [place],  [place], [place] [address] o [phone] o Mon-Fri 8:00-5:30, Sat&Sun by appointment (phones open at 8:30) o Babies seen by [facility] providers o Accepting Medicaid o Must be a first-time baby or sibling of current patient . [place], [address], [place], [place]  [address] o [phone]   Fax - [phone]  [place] [phone] & [phone]) . [place], [place]; [place], [place]; Benjamine Mola, MD; [place], [place]; Harrell Lark, MD o [address] [place], [place] [address] o [phone] o Mon-Thur 8:00-7:00, Fri 8:00-5:00, Sat 8:00-12:00, Sun 9:00-12:00 o Babies seen by [facility] providers o Accepting Medicaid . [facility] at [facility], MD; Ruthann Cancer, MD; [facility], MD o [address], [place], [place] [address] o [phone] o Mon-Thur 8:00-5:00 o Babies seen by providers at [facility] o Accepting Medicaid . [facility] at [place], MD; Coe[place]Go[place], MD; Amedeo Plenty, MD; Bobby Rumpf, MD; List, MD; [place] Drafts, MD; Ruthann Cancer, MD; Selinda Eon, MD; Audie Box, MD; Jim Like, MD; Christie Nottingham, MD; Hubbard Hartshorn, MD; [place] Nunnery, MD o [address] [place] [place], [place] [address] o [phone] o Mon-Fri 8:00-5:30, Sat (Oct.-Mar.) 9:00-1:00 o Babies seen by providers at [facility] o Accepting Medicaid o Must fill out new patient packet, available online at [email or web address] . [place] ([facility]) Barnabas Lister, NP; Kenton Kingfisher, NP; Claiborne Billings, NP; Rolla Plate, MD; [place], [place]; Carola Rhine, MD; Tyron Russell, MD; Delia Chimes, NP o [address], [place], [place] [address] o [phone] o Mon-Thur 8:00-5:30, Fri 8:00-5:00 o Babies seen by providers at [place] [phone]) . [place], [place]; Dawn, MD; Dennard Schaumann, MD; [place], [place] o [address] [place], [place], [place] [address] o [phone] o Mon-Fri 8:00-5:00 o Babies seen by providers at [place] [phone]) . [place] at [place], [place]; Olen Pel, MD; [place], [place], [place], [place] [address] o [phone] o Mon-Fri 8:00-5:00 o Babies seen by providers at [facility] o Does NOT accept Medicaid o Limited appointment availability, please call early in hospitalization  . Therapist, music at [place], [place]; [place], [place] [address] [place], [place] [address] o [phone] o Mon-Fri 8:00-5:00 o Babies seen by [facility] providers o Does NOT accept Medicaid . [facility] Su [place], MD; Guy Sandifer, MD; [place], [place]; [place], [place] [address], [place], [place] [address] o [phone] o Mon-Fri 8:00-5:00 o After hours clinic [facility] [place], [place] [address]) [phone] Mon-Fri 5:00-8:00, Sat 12:00-6:00, Sun 10:00-4:00 o Babies seen by [facility] providers o Accepting Medicaid . [place] at [facility] o [address], [place], [place]  [address] o [phone]   Fax - [phone]  [place] [phone]) . Therapist, music at [facility], MD o [address] [place], [place], [place] [address] o [phone] o Mon-Fri 8:00-5:00 o Babies seen by [facility] providers o Does NOT accept Medicaid . [place] ([place] at [place]) Bing Neighbors, MD o [address] [place], [place], [place] [address] o [phone] o Mon-Thur 8:00-7:00, Fri 8:00-5:00, Sat 8:00-12:00 o Babies seen by providers at [facility] o Accepting Medicaid - but does not have vaccinations in office (must be received elsewhere) o Limited availability, please call early in hospitalization  [place] ([id number]) . [place], [place], [place] [address] o [phone]  Fax [phone]  Levonorgestrel intrauterine device (IUD) What is this medicine? LEVONORGESTREL IUD (LEE voe nor jes trel) is a contraceptive (birth control) device. The device is placed inside the uterus by a healthcare professional. It is used to prevent pregnancy. This device can also be used to treat heavy bleeding that occurs during your period. This medicine may be used for other purposes; ask your health care provider or pharmacist if you have questions. COMMON BRAND NAME(S): Minette Headland What should I tell my health care provider before I take this medicine? They need to know if you have any of these conditions:  abnormal Pap smear  cancer of the breast, uterus, or cervix  diabetes  endometritis  genital or pelvic infection now or in the past  have more than one sexual partner or your partner has more than one partner  heart disease  history of an ectopic or tubal pregnancy  immune system problems  IUD in place  liver disease or tumor  problems with blood clots or take blood-thinners  seizures  use intravenous drugs  uterus of unusual shape  vaginal bleeding that has not been explained  an unusual or allergic reaction to levonorgestrel, other hormones, silicone, or polyethylene, medicines, foods, dyes, or preservatives  pregnant or trying to get pregnant  breast-feeding How should I use this medicine? This device is placed inside the uterus by a health care professional. Talk to your pediatrician regarding the use of this medicine in children. Special care may be needed. Overdosage: If you think you have taken too much of this medicine contact a poison control center or emergency room at once. NOTE: This medicine is only for you. Do not share this medicine with others. What if I miss a dose? This does not apply. Depending on the brand of device you have inserted, the device will need to be  replaced every 3 to 6 years if you wish to continue using this type of birth control. What may interact with this medicine? Do not take this medicine with any of the following medications:  amprenavir  bosentan  fosamprenavir This medicine may also interact with the following  medications:  aprepitant  armodafinil  barbiturate medicines for inducing sleep or treating seizures  bexarotene  boceprevir  griseofulvin  medicines to treat seizures like carbamazepine, ethotoin, felbamate, oxcarbazepine, phenytoin, topiramate  modafinil  pioglitazone  rifabutin  rifampin  rifapentine  some medicines to treat HIV infection like atazanavir, efavirenz, indinavir, lopinavir, nelfinavir, tipranavir, ritonavir  St. John's wort  warfarin This list may not describe all possible interactions. Give your health care provider a list of all the medicines, herbs, non-prescription drugs, or dietary supplements you use. Also tell them if you smoke, drink alcohol, or use illegal drugs. Some items may interact with your medicine. What should I watch for while using this medicine? Visit your doctor or health care professional for regular check ups. See your doctor if you or your partner has sexual contact with others, becomes HIV positive, or gets a sexual transmitted disease. This product does not protect you against HIV infection (AIDS) or other sexually transmitted diseases. You can check the placement of the IUD yourself by reaching up to the top of your vagina with clean fingers to feel the threads. Do not pull on the threads. It is a good habit to check placement after each menstrual period. Call your doctor right away if you feel more of the IUD than just the threads or if you cannot feel the threads at all. The IUD may come out by itself. You may become pregnant if the device comes out. If you notice that the IUD has come out use a backup birth control method like condoms and call your  health care provider. Using tampons will not change the position of the IUD and are okay to use during your period. This IUD can be safely scanned with magnetic resonance imaging (MRI) only under specific conditions. Before you have an MRI, tell your healthcare provider that you have an IUD in place, and which type of IUD you have in place. What side effects may I notice from receiving this medicine? Side effects that you should report to your doctor or health care professional as soon as possible:  allergic reactions like skin rash, itching or hives, swelling of the face, lips, or tongue  fever, flu-like symptoms  genital sores  high blood pressure  no menstrual period for 6 weeks during use  pain, swelling, warmth in the leg  pelvic pain or tenderness  severe or sudden headache  signs of pregnancy  stomach cramping  sudden shortness of breath  trouble with balance, talking, or walking  unusual vaginal bleeding, discharge  yellowing of the eyes or skin Side effects that usually do not require medical attention (report to your doctor or health care professional if they continue or are bothersome):  acne  breast pain  change in sex drive or performance  changes in weight  cramping, dizziness, or faintness while the device is being inserted  headache  irregular menstrual bleeding within first 3 to 6 months of use  nausea This list may not describe all possible side effects. Call your doctor for medical advice about side effects. You may report side effects to FDA at 1-800-FDA-1088. Where should I keep my medicine? This does not apply. NOTE: This sheet is a summary. It may not cover all possible information. If you have questions about this medicine, talk to your doctor, pharmacist, or health care provider.  2020 Elsevier/Gold Standard (2018-10-01 13:22:01)

## 2020-07-02 NOTE — Progress Notes (Signed)
PRENATAL VISIT NOTE  Subjective:  Ana Moore is a 34 y.o. G2P0010 at [redacted]w[redacted]d being seen today for ongoing prenatal care.  She is currently monitored for the following issues for this high-risk pregnancy and has Intrauterine pregnancy; Supervision of high risk pregnancy, antepartum; Ovarian cyst; Fibroid; Thyroid enlarged; and Obesity in pregnancy, antepartum on their problem list.  Patient reports heartburn.  Contractions: Not present. Vag. Bleeding: None.  Movement: Present. Denies leaking of fluid.   Wisdom Tooth Pain Patient reports pain in her wisdom tooth, for which she has been using Tylenol. She has questions today if she can have dental work done at this point in her pregnancy.   Tylenol Reaction She reports that she has been experiencing a racing heart rate when she has taken Tylenol recently. She has not experienced this in the past.   Family History of ASA Allergy She reports that she has not started the 81mg  aspirin due to an aspirin allergy present in her mother. She reports that she has never taken aspirin to her knowledge and is unsure if it is safe for her to take considering the family history of allergy.   The following portions of the patient's history were reviewed and updated as appropriate: allergies, current medications, past family history, past medical history, past social history, past surgical history and problem list. Problem list updated.  Objective:   Vitals:   07/02/20 0952  BP: (!) 131/62  Pulse: 84  Weight: 169 lb (76.7 kg)    Fetal Status: Fetal Heart Rate (bpm): 150   Movement: Present     General:  Alert, oriented and cooperative. Patient is in no acute distress.  Skin: Skin is warm and dry. No rash noted.   Cardiovascular: Normal heart rate noted  Respiratory: Normal respiratory effort, no problems with respiration noted  Abdomen: Soft, gravid, appropriate for gestational age.  Pain/Pressure: Absent     Pelvic: Cervical exam deferred         Extremities: Normal range of motion.  Edema: None  Mental Status:  Normal mood and affect. Normal behavior. Normal judgment and thought content.   Assessment and Plan:  Pregnancy: G2P0010 at [redacted]w[redacted]d  1. Supervision of high risk pregnancy, antepartum Pregnancy progressing without complication at this time.  BP good today. Discussed the importance of weekly BP monitoring. Discussed parameters of SBP>140 or DBP>90 not improved with rest and hydration that would warrant evaluation in the MAU.  We will perform additional thyroid testing today for enlarged thyroid. Discussed that dental procedure can be performed and note provided for dental office at visit today.  Discussed avoidance of Tylenol if this is causing symptoms. Although intolerance to Tylenol is rare, this is a possibility.  Discussed the use of ASA in pregnancy for pre-eclampsia prevention, however, given her unknown allergy status to aspirin and being past the 20 week mark, we will defer aspirin therapy for her. If she does have future pregnancies, it may be helpful to test for ASA allergy Ana Moore in the pregnancy and start at 12 weeks.  Discussed contraception options after delivery. Patient does not wish to have other children immediately. Information provided on IUD and OCP.  Partner awaiting insurance information for completion of genetic testing. Most recent Pap shows ASCUS with negative HPV- recommend follow-up Pap in 1 year.     2. Obesity in pregnancy, antepartum Initial overweight readings with subsequent weight loss in Ana Moore pregnancy. She has gained about 3 pounds since her last visit and endorses some  improvement in her appetite and decreased nausea.  Discussed the importance of small, frequent snacks high in protein as the pregnancy progresses to provide adequate nutrition for fetal growth and to prevent illness or additional weight loss. Measuring at 22cm today.  Growth U/S scheduled for next week (07/08/2020). Will  follow.    3. Thyroid enlarged Enlarged thyroid on assessment.  Lab results reveal normal TSH.  Will monitor with additional thyroid labs. - T3 - T4, free  Preterm labor symptoms and general obstetric precautions including but not limited to vaginal bleeding, contractions, leaking of fluid and fetal movement were reviewed in detail with the patient. Please refer to After Visit Summary for other counseling recommendations.  Return in about 4 weeks (around 07/30/2020) for LOB, In-Person.  U/S 07/08/2020   Orma Render, NP  Attestation of Supervision:  I confirm that I have verified the information documented in the nurse practitioner's note and that I have also personally reperformed the history, physical exam and all medical decision making activities.  I have verified that all services and findings are accurately documented in this note; and I agree with management and plan as outlined in the documentation. I have also made any necessary editorial changes.  Peds list given today  [facility]   Kerry Hough, [facility] [employer], [place] [facility] 07/02/2020 12:21 PM

## 2020-07-03 LAB — T4, FREE: Free T4: 0.95 ng/dL (ref 0.82–1.77)

## 2020-07-03 LAB — T3: T3, Total: 247 ng/dL — ABNORMAL HIGH (ref 71–180)

## 2020-07-05 ENCOUNTER — Other Ambulatory Visit: Payer: Self-pay | Admitting: Medical

## 2020-07-05 DIAGNOSIS — E049 Nontoxic goiter, unspecified: Secondary | ICD-10-CM

## 2020-07-06 NOTE — Telephone Encounter (Signed)
Patient's concerns were addressed at recent office visit.

## 2020-07-07 LAB — T3, FREE: T3, Free: 3.4 pg/mL (ref 2.0–4.4)

## 2020-07-07 LAB — SPECIMEN STATUS REPORT

## 2020-07-08 ENCOUNTER — Ambulatory Visit: Payer: Medicaid Other | Attending: Obstetrics and Gynecology

## 2020-07-08 ENCOUNTER — Encounter: Payer: Self-pay | Admitting: *Deleted

## 2020-07-08 ENCOUNTER — Other Ambulatory Visit: Payer: Self-pay

## 2020-07-08 ENCOUNTER — Ambulatory Visit: Payer: Medicaid Other | Admitting: *Deleted

## 2020-07-08 DIAGNOSIS — O99210 Obesity complicating pregnancy, unspecified trimester: Secondary | ICD-10-CM

## 2020-07-08 DIAGNOSIS — D219 Benign neoplasm of connective and other soft tissue, unspecified: Secondary | ICD-10-CM | POA: Insufficient documentation

## 2020-07-08 DIAGNOSIS — D259 Leiomyoma of uterus, unspecified: Secondary | ICD-10-CM | POA: Diagnosis not present

## 2020-07-08 DIAGNOSIS — O3412 Maternal care for benign tumor of corpus uteri, second trimester: Secondary | ICD-10-CM

## 2020-07-08 DIAGNOSIS — E049 Nontoxic goiter, unspecified: Secondary | ICD-10-CM | POA: Insufficient documentation

## 2020-07-08 DIAGNOSIS — O0990 Supervision of high risk pregnancy, unspecified, unspecified trimester: Secondary | ICD-10-CM

## 2020-07-08 DIAGNOSIS — Z362 Encounter for other antenatal screening follow-up: Secondary | ICD-10-CM | POA: Insufficient documentation

## 2020-07-08 DIAGNOSIS — Z3A23 23 weeks gestation of pregnancy: Secondary | ICD-10-CM

## 2020-07-08 DIAGNOSIS — Z148 Genetic carrier of other disease: Secondary | ICD-10-CM | POA: Diagnosis not present

## 2020-07-30 ENCOUNTER — Other Ambulatory Visit: Payer: Medicaid Other

## 2020-07-30 ENCOUNTER — Other Ambulatory Visit: Payer: Self-pay | Admitting: *Deleted

## 2020-07-30 ENCOUNTER — Other Ambulatory Visit: Payer: Self-pay

## 2020-07-30 ENCOUNTER — Ambulatory Visit (INDEPENDENT_AMBULATORY_CARE_PROVIDER_SITE_OTHER): Payer: Medicaid Other | Admitting: Medical

## 2020-07-30 ENCOUNTER — Encounter: Payer: Self-pay | Admitting: Medical

## 2020-07-30 VITALS — BP 117/72 | HR 76 | Wt 171.0 lb

## 2020-07-30 DIAGNOSIS — O0990 Supervision of high risk pregnancy, unspecified, unspecified trimester: Secondary | ICD-10-CM

## 2020-07-30 DIAGNOSIS — Z23 Encounter for immunization: Secondary | ICD-10-CM | POA: Diagnosis not present

## 2020-07-30 DIAGNOSIS — O99210 Obesity complicating pregnancy, unspecified trimester: Secondary | ICD-10-CM

## 2020-07-30 DIAGNOSIS — E049 Nontoxic goiter, unspecified: Secondary | ICD-10-CM

## 2020-07-30 DIAGNOSIS — D219 Benign neoplasm of connective and other soft tissue, unspecified: Secondary | ICD-10-CM

## 2020-07-30 DIAGNOSIS — Z3A26 26 weeks gestation of pregnancy: Secondary | ICD-10-CM

## 2020-07-30 NOTE — Progress Notes (Signed)
   PRENATAL VISIT NOTE  Subjective:  Ana Moore is a 34 y.o. G2P0010 at [redacted]w[redacted]d being seen today for ongoing prenatal care.  She is currently monitored for the following issues for this high-risk pregnancy and has Supervision of high risk pregnancy, antepartum; Ovarian cyst; Fibroid; Thyroid enlarged; and Obesity in pregnancy, antepartum on their problem list.  Patient reports no complaints.  Contractions: Not present. Vag. Bleeding: None.  Movement: Present. Denies leaking of fluid.   The following portions of the patient's history were reviewed and updated as appropriate: allergies, current medications, past family history, past medical history, past social history, past surgical history and problem list.   Objective:   Vitals:   07/30/20 0856  BP: 117/72  Pulse: 76  Weight: 171 lb (77.6 kg)    Fetal Status: Fetal Heart Rate (bpm): 136 Fundal Height: 27 cm Movement: Present     General:  Alert, oriented and cooperative. Patient is in no acute distress.  Skin: Skin is warm and dry. No rash noted.   Cardiovascular: Normal heart rate noted  Respiratory: Normal respiratory effort, no problems with respiration noted  Abdomen: Soft, gravid, appropriate for gestational age.  Pain/Pressure: Absent     Pelvic: Cervical exam deferred        Extremities: Normal range of motion.  Edema: None  Mental Status: Normal mood and affect. Normal behavior. Normal judgment and thought content.   Assessment and Plan:  Pregnancy: G2P0010 at [redacted]w[redacted]d 1. Supervision of high risk pregnancy, antepartum - 2 hour GTT, CBC, HIV and RPR today  - Tdap vaccine greater than or equal to 7yo IM - Still considering Peds, has list, importance of deciding by 36 weeks discussed  - Planning PP IUD for MOC  2. Obesity in pregnancy, antepartum  3. Fibroid - Per MFM, [place] in third trimester for growth, planning at 36 weeks   4. Thyroid enlarged - Normal thyroid testing in this pregnancy   5. [redacted] weeks gestation of  pregnancy  Preterm labor symptoms and general obstetric precautions including but not limited to vaginal bleeding, contractions, leaking of fluid and fetal movement were reviewed in detail with the patient. Please refer to After Visit Summary for other counseling recommendations.   Return in about 2 weeks (around 08/13/2020) for LOB, In-Person.  Future Appointments  Date Time Provider [place]  07/30/2020  9:30 AM [facility]    Kerry Hough, PA-C

## 2020-07-30 NOTE — Patient Instructions (Signed)
Fetal Movement Counts Patient Name: ________________________________________________ Patient Due Date: ____________________ What is a fetal movement count?  A fetal movement count is the number of times that you feel your baby move during a certain amount of time. This may also be called a fetal kick count. A fetal movement count is recommended for every pregnant woman. You may be asked to start counting fetal movements as early as week 28 of your pregnancy. Pay attention to when your baby is most active. You may notice your baby's sleep and wake cycles. You may also notice things that make your baby move more. You should do a fetal movement count:  When your baby is normally most active.  At the same time each day. A good time to count movements is while you are resting, after having something to eat and drink. How do I count fetal movements? 1. Find a quiet, comfortable area. Sit, or lie down on your side. 2. Write down the date, the start time and stop time, and the number of movements that you felt between those two times. Take this information with you to your health care visits. 3. Write down your start time when you feel the first movement. 4. Count kicks, flutters, swishes, rolls, and jabs. You should feel at least 10 movements. 5. You may stop counting after you have felt 10 movements, or if you have been counting for 2 hours. Write down the stop time. 6. If you do not feel 10 movements in 2 hours, contact your health care provider for further instructions. Your health care provider may want to do additional tests to assess your baby's well-being. Contact a health care provider if:  You feel fewer than 10 movements in 2 hours.  Your baby is not moving like he or she usually does. Date: ____________ Start time: ____________ Stop time: ____________ Movements: ____________ Date: ____________ Start time: ____________ Stop time: ____________ Movements: ____________ Date: ____________  Start time: ____________ Stop time: ____________ Movements: ____________ Date: ____________ Start time: ____________ Stop time: ____________ Movements: ____________ Date: ____________ Start time: ____________ Stop time: ____________ Movements: ____________ Date: ____________ Start time: ____________ Stop time: ____________ Movements: ____________ Date: ____________ Start time: ____________ Stop time: ____________ Movements: ____________ Date: ____________ Start time: ____________ Stop time: ____________ Movements: ____________ Date: ____________ Start time: ____________ Stop time: ____________ Movements: ____________ This information is not intended to replace advice given to you by your health care provider. Make sure you discuss any questions you have with your health care provider. Document Revised: 07/10/2019 Document Reviewed: 07/10/2019 Elsevier Patient Education  2020 Elsevier Inc. Braxton Hicks Contractions Contractions of the uterus can occur throughout pregnancy, but they are not always a sign that you are in labor. You may have practice contractions called Braxton Hicks contractions. These false labor contractions are sometimes confused with true labor. What are Braxton Hicks contractions? Braxton Hicks contractions are tightening movements that occur in the muscles of the uterus before labor. Unlike true labor contractions, these contractions do not result in opening (dilation) and thinning of the cervix. Toward the end of pregnancy (32-34 weeks), Braxton Hicks contractions can happen more often and may become stronger. These contractions are sometimes difficult to tell apart from true labor because they can be very uncomfortable. You should not feel embarrassed if you go to the hospital with false labor. Sometimes, the only way to tell if you are in true labor is for your health care provider to look for changes in the cervix. The health care provider   will do a physical exam and may  monitor your contractions. If you are not in true labor, the exam should show that your cervix is not dilating and your water has not broken. If there are no other health problems associated with your pregnancy, it is completely safe for you to be sent home with false labor. You may continue to have Braxton Hicks contractions until you go into true labor. How to tell the difference between true labor and false labor True labor  Contractions last 30-70 seconds.  Contractions become very regular.  Discomfort is usually felt in the top of the uterus, and it spreads to the lower abdomen and low back.  Contractions do not go away with walking.  Contractions usually become more intense and increase in frequency.  The cervix dilates and gets thinner. False labor  Contractions are usually shorter and not as strong as true labor contractions.  Contractions are usually irregular.  Contractions are often felt in the front of the lower abdomen and in the groin.  Contractions may go away when you walk around or change positions while lying down.  Contractions get weaker and are shorter-lasting as time goes on.  The cervix usually does not dilate or become thin. Follow these instructions at home:   Take over-the-counter and prescription medicines only as told by your health care provider.  Keep up with your usual exercises and follow other instructions from your health care provider.  Eat and drink lightly if you think you are going into labor.  If Braxton Hicks contractions are making you uncomfortable: ? Change your position from lying down or resting to walking, or change from walking to resting. ? Sit and rest in a tub of warm water. ? Drink enough fluid to keep your urine pale yellow. Dehydration may cause these contractions. ? Do slow and deep breathing several times an hour.  Keep all follow-up prenatal visits as told by your health care provider. This is important. Contact a  health care provider if:  You have a fever.  You have continuous pain in your abdomen. Get help right away if:  Your contractions become stronger, more regular, and closer together.  You have fluid leaking or gushing from your vagina.  You pass blood-tinged mucus (bloody show).  You have bleeding from your vagina.  You have low back pain that you never had before.  You feel your baby's head pushing down and causing pelvic pressure.  Your baby is not moving inside you as much as it used to. Summary  Contractions that occur before labor are called Braxton Hicks contractions, false labor, or practice contractions.  Braxton Hicks contractions are usually shorter, weaker, farther apart, and less regular than true labor contractions. True labor contractions usually become progressively stronger and regular, and they become more frequent.  Manage discomfort from Braxton Hicks contractions by changing position, resting in a warm bath, drinking plenty of water, or practicing deep breathing. This information is not intended to replace advice given to you by your health care provider. Make sure you discuss any questions you have with your health care provider. Document Revised: 11/02/2017 Document Reviewed: 04/05/2017 Elsevier Patient Education  2020 Elsevier Inc.  

## 2020-07-31 LAB — CBC
Hematocrit: 31.8 % — ABNORMAL LOW (ref 34.0–46.6)
Hemoglobin: 10.4 g/dL — ABNORMAL LOW (ref 11.1–15.9)
MCH: 27.4 pg (ref 26.6–33.0)
MCHC: 32.7 g/dL (ref 31.5–35.7)
MCV: 84 fL (ref 79–97)
Platelets: 214 10*3/uL (ref 150–450)
RBC: 3.79 x10E6/uL (ref 3.77–5.28)
RDW: 14.1 % (ref 11.7–15.4)
WBC: 10.7 10*3/uL (ref 3.4–10.8)

## 2020-07-31 LAB — RPR: RPR Ser Ql: NONREACTIVE

## 2020-07-31 LAB — HIV ANTIBODY (ROUTINE TESTING W REFLEX): HIV Screen 4th Generation wRfx: NONREACTIVE

## 2020-07-31 LAB — GLUCOSE TOLERANCE, 2 HOURS W/ 1HR
Glucose, 1 hour: 164 mg/dL (ref 65–179)
Glucose, 2 hour: 134 mg/dL (ref 65–152)
Glucose, Fasting: 85 mg/dL (ref 65–91)

## 2020-08-01 ENCOUNTER — Other Ambulatory Visit: Payer: Self-pay

## 2020-08-01 ENCOUNTER — Encounter (HOSPITAL_COMMUNITY): Payer: Self-pay | Admitting: Family Medicine

## 2020-08-01 ENCOUNTER — Inpatient Hospital Stay (HOSPITAL_COMMUNITY)
Admission: AD | Admit: 2020-08-01 | Discharge: 2020-08-01 | Disposition: A | Discharge status: Home / Self Care | Payer: Medicaid Other | Attending: Family Medicine | Admitting: Family Medicine

## 2020-08-01 DIAGNOSIS — R109 Unspecified abdominal pain: Secondary | ICD-10-CM | POA: Insufficient documentation

## 2020-08-01 DIAGNOSIS — O26892 Other specified pregnancy related conditions, second trimester: Secondary | ICD-10-CM | POA: Diagnosis present

## 2020-08-01 DIAGNOSIS — Z3A26 26 weeks gestation of pregnancy: Secondary | ICD-10-CM | POA: Insufficient documentation

## 2020-08-01 DIAGNOSIS — O23592 Infection of other part of genital tract in pregnancy, second trimester: Secondary | ICD-10-CM

## 2020-08-01 DIAGNOSIS — B9689 Other specified bacterial agents as the cause of diseases classified elsewhere: Secondary | ICD-10-CM | POA: Diagnosis not present

## 2020-08-01 DIAGNOSIS — Z885 Allergy status to narcotic agent status: Secondary | ICD-10-CM | POA: Insufficient documentation

## 2020-08-01 DIAGNOSIS — Z79899 Other long term (current) drug therapy: Secondary | ICD-10-CM | POA: Diagnosis not present

## 2020-08-01 DIAGNOSIS — O4702 False labor before 37 completed weeks of gestation, second trimester: Secondary | ICD-10-CM

## 2020-08-01 DIAGNOSIS — Z87440 Personal history of urinary (tract) infections: Secondary | ICD-10-CM | POA: Insufficient documentation

## 2020-08-01 LAB — URINALYSIS, ROUTINE W REFLEX MICROSCOPIC
Bilirubin Urine: NEGATIVE
Glucose, UA: NEGATIVE mg/dL
Ketones, ur: NEGATIVE mg/dL
Nitrite: NEGATIVE
Protein, ur: NEGATIVE mg/dL
Specific Gravity, Urine: 1.003 — ABNORMAL LOW (ref 1.005–1.030)
pH: 6 (ref 5.0–8.0)

## 2020-08-01 LAB — WET PREP, GENITAL
Sperm: NONE SEEN
Trich, Wet Prep: NONE SEEN
Yeast Wet Prep HPF POC: NONE SEEN

## 2020-08-01 MED ORDER — LACTATED RINGERS IV BOLUS
1000.0000 mL | Freq: Once | INTRAVENOUS | Status: AC
  Administered 2020-08-01: 1000 mL via INTRAVENOUS

## 2020-08-01 MED ORDER — METRONIDAZOLE 500 MG PO TABS: 500.0000 mg | ORAL_TABLET | Freq: Two times a day (BID) | ORAL | 0 refills | Status: DC

## 2020-08-01 NOTE — Discharge Instructions (Signed)
Bacterial Vaginosis  Bacterial vaginosis is a vaginal infection that occurs when the normal balance of bacteria in the vagina is disrupted. It results from an overgrowth of certain bacteria. This is the most common vaginal infection among women ages 15-44. Because bacterial vaginosis increases your risk for STIs (sexually transmitted infections), getting treated can help reduce your risk for chlamydia, gonorrhea, herpes, and HIV (human immunodeficiency virus). Treatment is also important for preventing complications in pregnant women, because this condition can cause an early (premature) delivery. What are the causes? This condition is caused by an increase in harmful bacteria that are normally present in small amounts in the vagina. However, the reason that the condition develops is not fully understood. What increases the risk? The following factors may make you more likely to develop this condition:  Having a new sexual partner or multiple sexual partners.  Having unprotected sex.  Douching.  Having an intrauterine device (IUD).  Smoking.  Drug and alcohol abuse.  Taking certain antibiotic medicines.  Being pregnant. You cannot get bacterial vaginosis from toilet seats, bedding, swimming pools, or contact with objects around you. What are the signs or symptoms? Symptoms of this condition include:  Grey or white vaginal discharge. The discharge can also be watery or foamy.  A fish-like odor with discharge, especially after sexual intercourse or during menstruation.  Itching in and around the vagina.  Burning or pain with urination. Some women with bacterial vaginosis have no signs or symptoms. How is this diagnosed? This condition is diagnosed based on:  Your medical history.  A physical exam of the vagina.  Testing a sample of vaginal fluid under a microscope to look for a large amount of bad bacteria or abnormal cells. Your health care provider may use a cotton swab or  a small wooden spatula to collect the sample. How is this treated? This condition is treated with antibiotics. These may be given as a pill, a vaginal cream, or a medicine that is put into the vagina (suppository). If the condition comes back after treatment, a second round of antibiotics may be needed. Follow these instructions at home: Medicines  Take over-the-counter and prescription medicines only as told by your health care provider.  Take or use your antibiotic as told by your health care provider. Do not stop taking or using the antibiotic even if you start to feel better. General instructions  If you have a female sexual partner, tell her that you have a vaginal infection. She should see her health care provider and be treated if she has symptoms. If you have a female sexual partner, he does not need treatment.  During treatment: ? Avoid sexual activity until you finish treatment. ? Do not douche. ? Avoid alcohol as directed by your health care provider. ? Avoid breastfeeding as directed by your health care provider.  Drink enough water and fluids to keep your urine clear or pale yellow.  Keep the area around your vagina and rectum clean. ? Wash the area daily with warm water. ? Wipe yourself from front to back after using the toilet.  Keep all follow-up visits as told by your health care provider. This is important. How is this prevented?  Do not douche.  Wash the outside of your vagina with warm water only.  Use protection when having sex. This includes latex condoms and dental dams.  Limit how many sexual partners you have. To help prevent bacterial vaginosis, it is best to have sex with just one partner (  monogamous).  Make sure you and your sexual partner are tested for STIs.  Wear cotton or cotton-lined underwear.  Avoid wearing tight pants and pantyhose, especially during summer.  Limit the amount of alcohol that you drink.  Do not use any products that contain  nicotine or tobacco, such as cigarettes and e-cigarettes. If you need help quitting, ask your health care provider.  Do not use illegal drugs. Where to find more information  Centers for Disease Control and Prevention: www.cdc.gov/std  American Sexual Health Association (ASHA): www.ashastd.org  [place] Department of Health and Human Services, Office on Women's Health: www.womenshealth.gov/ or https://www.womenshealth.gov/a-z-topics/bacterial-vaginosis Contact a health care provider if:  Your symptoms do not improve, even after treatment.  You have more discharge or pain when urinating.  You have a fever.  You have pain in your abdomen.  You have pain during sex.  You have vaginal bleeding between periods. Summary  Bacterial vaginosis is a vaginal infection that occurs when the normal balance of bacteria in the vagina is disrupted.  Because bacterial vaginosis increases your risk for STIs (sexually transmitted infections), getting treated can help reduce your risk for chlamydia, gonorrhea, herpes, and HIV (human immunodeficiency virus). Treatment is also important for preventing complications in pregnant women, because the condition can cause an early (premature) delivery.  This condition is treated with antibiotic medicines. These may be given as a pill, a vaginal cream, or a medicine that is put into the vagina (suppository). This information is not intended to replace advice given to you by your health care provider. Make sure you discuss any questions you have with your health care provider. Document Revised: 11/02/2017 Document Reviewed: 08/05/2016 Elsevier Patient Education  2020 Elsevier Inc.  

## 2020-08-01 NOTE — MAU Note (Signed)
Ana Moore is a 35 y.o. at [redacted]w[redacted]d here in MAU reporting: left sided pain that feels like a knot every three mins. Denies VB/LOF +FM  Onset of complaint: 8am sat morning Pain score: 8 There were no vitals filed for this visit.   FHT:140 Lab orders placed from triage:
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
## 2020-08-02 LAB — GC/CHLAMYDIA PROBE AMP (~~LOC~~) NOT AT ARMC
Chlamydia: NEGATIVE
Comment: NEGATIVE
Comment: NORMAL
Neisseria Gonorrhea: NEGATIVE

## 2020-08-17 ENCOUNTER — Encounter: Payer: Self-pay | Admitting: *Deleted

## 2020-08-20 ENCOUNTER — Other Ambulatory Visit: Payer: Self-pay

## 2020-08-20 ENCOUNTER — Encounter: Payer: Self-pay | Admitting: Medical

## 2020-08-20 ENCOUNTER — Ambulatory Visit (INDEPENDENT_AMBULATORY_CARE_PROVIDER_SITE_OTHER): Payer: Medicaid Other | Admitting: Medical

## 2020-08-20 VITALS — BP 105/71 | HR 71 | Wt 175.0 lb

## 2020-08-20 DIAGNOSIS — O0990 Supervision of high risk pregnancy, unspecified, unspecified trimester: Secondary | ICD-10-CM

## 2020-08-20 DIAGNOSIS — D219 Benign neoplasm of connective and other soft tissue, unspecified: Secondary | ICD-10-CM

## 2020-08-20 DIAGNOSIS — Z3A29 29 weeks gestation of pregnancy: Secondary | ICD-10-CM

## 2020-08-20 DIAGNOSIS — O99210 Obesity complicating pregnancy, unspecified trimester: Secondary | ICD-10-CM

## 2020-08-20 NOTE — Progress Notes (Signed)
  History:  [name] is a 34 y.o. G2P0010 who presents to clinic today for routine high risk prenatal appointment. Being monitored for high risk pregnancy, ovarian cyst, fibroid, thyroid enlarged, and obesity in pregnancy.  She reports that she went to the emergency room on 8/29 for preterm uterine contractions but resolved after IV fluid administration. Denies fluid loss or vaginal bleeding. Mild +1 bilateral edema in the ankles, patient attributes this to being on her feet all day at work. Reports good fetal movement.  The following portions of the patient's history were reviewed and updated as appropriate: allergies, current medications, family history, past medical history, social history, past surgical history and problem list.  Review of Systems:  Review of Systems  Gastrointestinal: Negative for abdominal pain.  Genitourinary:       Negative for Fluid loss, bleeding, discharge  Musculoskeletal: Negative for myalgias.      Objective:  Physical Exam BP 105/71   Pulse 71   Wt 175 lb (79.4 kg)   LMP 01/28/2020 (Exact Date)   BMI 30.04 kg/m  Physical Exam Genitourinary:    Comments: Fundal Height: 31 cm Musculoskeletal:     Right lower leg: Edema (Non-pitting) present.     Left lower leg: Edema (Non-pitting) present.     Labs and Imaging No results found for this or any previous visit (from the past 24 hour(s)).  No results found.   Assessment & Plan:  1. Supervision of Low risk pregnancy, antepartum - Follow up in 2 weeks or PRN if new symptoms develop. - Still considering pediatrician, list provided. Discussed it is preferred she decides by 36 weeks. - Continued plan for PP IUD and breast feeding.  2. Obesity in pregnancy, antepartum - Fetal growth ultrasound scheduled for 36 weeks due to unreliable fundal height measurement.  3. [redacted] weeks gestation of pregnancy - Follow up in 2 weeks or PRN if new symptoms develop.   4. Fibroid  - Fetal growth  ultrasound scheduled for 36 weeks due to unreliable fundal height measurement.  5. Thyroid enlarged - normal thyroid function labs in this pregnancy    Approximately 15 minutes of total time was spent with this patient on 08/20/20  Drusilla Kanner 08/20/2020 10:40 AM

## 2020-08-20 NOTE — Progress Notes (Signed)
   PRENATAL VISIT NOTE  Subjective:  Ana Moore is a 34 y.o. G2P0010 at [redacted]w[redacted]d being seen today for ongoing prenatal care.  She is currently monitored for the following issues for this high-risk pregnancy and has Supervision of high risk pregnancy, antepartum; Ovarian cyst; Fibroid; Thyroid enlarged; and Obesity in pregnancy, antepartum on their problem list.  Patient reports no complaints.  Contractions: Not present. Vag. Bleeding: None.  Movement: Present. Denies leaking of fluid.   The following portions of the patient's history were reviewed and updated as appropriate: allergies, current medications, past family history, past medical history, past social history, past surgical history and problem list.   Objective:   Vitals:   08/20/20 0845  BP: 105/71  Pulse: 71  Weight: 175 lb (79.4 kg)    Fetal Status: Fetal Heart Rate (bpm): 154 Fundal Height: 31 cm Movement: Present     General:  Alert, oriented and cooperative. Patient is in no acute distress.  Skin: Skin is warm and dry. No rash noted.   Cardiovascular: Normal heart rate noted  Respiratory: Normal respiratory effort, no problems with respiration noted  Abdomen: Soft, gravid, appropriate for gestational age.  Pain/Pressure: Absent     Pelvic: Cervical exam deferred        Extremities: Normal range of motion.  Edema: Trace  Mental Status: Normal mood and affect. Normal behavior. Normal judgment and thought content.   Assessment and Plan:  Pregnancy: G2P0010 at [redacted]w[redacted]d 1. Supervision of high risk pregnancy, antepartum - Doing well - Reviewed MAU visit from late August, no contractions since that visit - Reviewed importance of choosing a pediatrician prior to 36 weeks, patient has list  - Reviewed normal third trimester labs from last visit   2. Obesity in pregnancy, antepartum - [place] MFM OB FOLLOW UP; Future  3. [redacted] weeks gestation of pregnancy  4. Fibroid - [place] MFM OB FOLLOW UP; Future  Preterm labor symptoms  and general obstetric precautions including but not limited to vaginal bleeding, contractions, leaking of fluid and fetal movement were reviewed in detail with the patient. Please refer to After Visit Summary for other counseling recommendations.   Return in about 2 weeks (around 09/03/2020) for LOB, In-Person.  Future Appointments  Date Time Provider [place]  09/03/2020  9:30 AM Danielle Rankin [facility] [place]    Kerry Hough, PA-C

## 2020-08-20 NOTE — Patient Instructions (Signed)
Fetal Movement Counts Patient Name: ________________________________________________ Patient Due Date: ____________________ What is a fetal movement count?  A fetal movement count is the number of times that you feel your baby move during a certain amount of time. This may also be called a fetal kick count. A fetal movement count is recommended for every pregnant woman. You may be asked to start counting fetal movements as early as week 28 of your pregnancy. Pay attention to when your baby is most active. You may notice your baby's sleep and wake cycles. You may also notice things that make your baby move more. You should do a fetal movement count:  When your baby is normally most active.  At the same time each day. A good time to count movements is while you are resting, after having something to eat and drink. How do I count fetal movements? 1. Find a quiet, comfortable area. Sit, or lie down on your side. 2. Write down the date, the start time and stop time, and the number of movements that you felt between those two times. Take this information with you to your health care visits. 3. Write down your start time when you feel the first movement. 4. Count kicks, flutters, swishes, rolls, and jabs. You should feel at least 10 movements. 5. You may stop counting after you have felt 10 movements, or if you have been counting for 2 hours. Write down the stop time. 6. If you do not feel 10 movements in 2 hours, contact your health care provider for further instructions. Your health care provider may want to do additional tests to assess your baby's well-being. Contact a health care provider if:  You feel fewer than 10 movements in 2 hours.  Your baby is not moving like he or she usually does. Date: ____________ Start time: ____________ Stop time: ____________ Movements: ____________ Date: ____________ Start time: ____________ Stop time: ____________ Movements: ____________ Date: ____________  Start time: ____________ Stop time: ____________ Movements: ____________ Date: ____________ Start time: ____________ Stop time: ____________ Movements: ____________ Date: ____________ Start time: ____________ Stop time: ____________ Movements: ____________ Date: ____________ Start time: ____________ Stop time: ____________ Movements: ____________ Date: ____________ Start time: ____________ Stop time: ____________ Movements: ____________ Date: ____________ Start time: ____________ Stop time: ____________ Movements: ____________ Date: ____________ Start time: ____________ Stop time: ____________ Movements: ____________ This information is not intended to replace advice given to you by your health care provider. Make sure you discuss any questions you have with your health care provider. Document Revised: 07/10/2019 Document Reviewed: 07/10/2019 Elsevier Patient Education  2020 [employer] of the uterus can occur throughout pregnancy, but they are not always a sign that you are in labor. You may have practice contractions called Braxton Hicks contractions. These false labor contractions are sometimes confused with true labor. What are [place] contractions? Braxton Hicks contractions are tightening movements that occur in the muscles of the uterus before labor. Unlike true labor contractions, these contractions do not result in opening (dilation) and thinning of the cervix. Toward the end of pregnancy (32-34 weeks), Braxton Hicks contractions can happen more often and may become stronger. These contractions are sometimes difficult to tell apart from true labor because they can be very uncomfortable. You should not feel embarrassed if you go to the hospital with false labor. Sometimes, the only way to tell if you are in true labor is for your health care provider to look for changes in the cervix. The health care provider  will do a physical exam and may  monitor your contractions. If you are not in true labor, the exam should show that your cervix is not dilating and your water has not broken. If there are no other health problems associated with your pregnancy, it is completely safe for you to be sent home with false labor. You may continue to have Braxton Hicks contractions until you go into true labor. How to tell the difference between true labor and false labor True labor  Contractions last 30-70 seconds.  Contractions become very regular.  Discomfort is usually felt in the top of the uterus, and it spreads to the lower abdomen and low back.  Contractions do not go away with walking.  Contractions usually become more intense and increase in frequency.  The cervix dilates and gets thinner. False labor  Contractions are usually shorter and not as strong as true labor contractions.  Contractions are usually irregular.  Contractions are often felt in the front of the lower abdomen and in the groin.  Contractions may go away when you walk around or change positions while lying down.  Contractions get weaker and are shorter-lasting as time goes on.  The cervix usually does not dilate or become thin. Follow these instructions at home:   Take over-the-counter and prescription medicines only as told by your health care provider.  Keep up with your usual exercises and follow other instructions from your health care provider.  Eat and drink lightly if you think you are going into labor.  If Braxton Hicks contractions are making you uncomfortable: ? Change your position from lying down or resting to walking, or change from walking to resting. ? Sit and rest in a tub of warm water. ? Drink enough fluid to keep your urine pale yellow. Dehydration may cause these contractions. ? Do slow and deep breathing several times an hour.  Keep all follow-up prenatal visits as told by your health care provider. This is important. Contact a  health care provider if:  You have a fever.  You have continuous pain in your abdomen. Get help right away if:  Your contractions become stronger, more regular, and closer together.  You have fluid leaking or gushing from your vagina.  You pass blood-tinged mucus (bloody show).  You have bleeding from your vagina.  You have low back pain that you never had before.  You feel your baby's head pushing down and causing pelvic pressure.  Your baby is not moving inside you as much as it used to. Summary  Contractions that occur before labor are called Braxton Hicks contractions, false labor, or practice contractions.  Braxton Hicks contractions are usually shorter, weaker, farther apart, and less regular than true labor contractions. True labor contractions usually become progressively stronger and regular, and they become more frequent.  Manage discomfort from Braxton Hicks contractions by changing position, resting in a warm bath, drinking plenty of water, or practicing deep breathing. This information is not intended to replace advice given to you by your health care provider. Make sure you discuss any questions you have with your health care provider. Document Revised: 11/02/2017 Document Reviewed: 04/05/2017 Elsevier Patient Education  2020 Elsevier Inc[employer]  

## 2020-09-03 ENCOUNTER — Other Ambulatory Visit: Payer: Self-pay

## 2020-09-03 ENCOUNTER — Ambulatory Visit (INDEPENDENT_AMBULATORY_CARE_PROVIDER_SITE_OTHER): Payer: Medicaid Other | Admitting: Medical

## 2020-09-03 ENCOUNTER — Encounter: Payer: Self-pay | Admitting: Medical

## 2020-09-03 VITALS — BP 117/68 | HR 71 | Wt 175.2 lb

## 2020-09-03 DIAGNOSIS — O0990 Supervision of high risk pregnancy, unspecified, unspecified trimester: Secondary | ICD-10-CM

## 2020-09-03 DIAGNOSIS — N949 Unspecified condition associated with female genital organs and menstrual cycle: Secondary | ICD-10-CM

## 2020-09-03 DIAGNOSIS — O99210 Obesity complicating pregnancy, unspecified trimester: Secondary | ICD-10-CM

## 2020-09-03 MED ORDER — COMFORT FIT MATERNITY SUPP LG MISC: 1.0000 [IU] | Freq: Every day | 0 refills | Status: DC | PRN

## 2020-09-03 NOTE — Patient Instructions (Addendum)
Fetal Movement Counts Patient Name: ________________________________________________ Patient Due Date: ____________________ What is a fetal movement count?  A fetal movement count is the number of times that you feel your baby move during a certain amount of time. This may also be called a fetal kick count. A fetal movement count is recommended for every pregnant woman. You may be asked to start counting fetal movements as early as week 28 of your pregnancy. Pay attention to when your baby is most active. You may notice your baby's sleep and wake cycles. You may also notice things that make your baby move more. You should do a fetal movement count:  When your baby is normally most active.  At the same time each day. A good time to count movements is while you are resting, after having something to eat and drink. How do I count fetal movements? 1. Find a quiet, comfortable area. Sit, or lie down on your side. 2. Write down the date, the start time and stop time, and the number of movements that you felt between those two times. Take this information with you to your health care visits. 3. Write down your start time when you feel the first movement. 4. Count kicks, flutters, swishes, rolls, and jabs. You should feel at least 10 movements. 5. You may stop counting after you have felt 10 movements, or if you have been counting for 2 hours. Write down the stop time. 6. If you do not feel 10 movements in 2 hours, contact your health care provider for further instructions. Your health care provider may want to do additional tests to assess your baby's well-being. Contact a health care provider if:  You feel fewer than 10 movements in 2 hours.  Your baby is not moving like he or she usually does. Date: ____________ Start time: ____________ Stop time: ____________ Movements: ____________ Date: ____________ Start time: ____________ Stop time: ____________ Movements: ____________ Date: ____________  Start time: ____________ Stop time: ____________ Movements: ____________ Date: ____________ Start time: ____________ Stop time: ____________ Movements: ____________ Date: ____________ Start time: ____________ Stop time: ____________ Movements: ____________ Date: ____________ Start time: ____________ Stop time: ____________ Movements: ____________ Date: ____________ Start time: ____________ Stop time: ____________ Movements: ____________ Date: ____________ Start time: ____________ Stop time: ____________ Movements: ____________ Date: ____________ Start time: ____________ Stop time: ____________ Movements: ____________ This information is not intended to replace advice given to you by your health care provider. Make sure you discuss any questions you have with your health care provider. Document Revised: 07/10/2019 Document Reviewed: 07/10/2019 Elsevier Patient Education  2020 Elsevier Inc. [employer] of the uterus can occur throughout pregnancy, but they are not always a sign that you are in labor. You may have practice contractions called Braxton Hicks contractions. These false labor contractions are sometimes confused with true labor. What are Montine Circle contractions? Braxton Hicks contractions are tightening movements that occur in the muscles of the uterus before labor. Unlike true labor contractions, these contractions do not result in opening (dilation) and thinning of the cervix. Toward the end of pregnancy (32-34 weeks), Braxton Hicks contractions can happen more often and may become stronger. These contractions are sometimes difficult to tell apart from true labor because they can be very uncomfortable. You should not feel embarrassed if you go to the hospital with false labor. Sometimes, the only way to tell if you are in true labor is for your health care provider to look for changes in the cervix. The health care provider  will do a physical exam and may  monitor your contractions. If you are not in true labor, the exam should show that your cervix is not dilating and your water has not broken. If there are no other health problems associated with your pregnancy, it is completely safe for you to be sent home with false labor. You may continue to have Braxton Hicks contractions until you go into true labor. How to tell the difference between true labor and false labor True labor  Contractions last 30-70 seconds.  Contractions become very regular.  Discomfort is usually felt in the top of the uterus, and it spreads to the lower abdomen and low back.  Contractions do not go away with walking.  Contractions usually become more intense and increase in frequency.  The cervix dilates and gets thinner. False labor  Contractions are usually shorter and not as strong as true labor contractions.  Contractions are usually irregular.  Contractions are often felt in the front of the lower abdomen and in the groin.  Contractions may go away when you walk around or change positions while lying down.  Contractions get weaker and are shorter-lasting as time goes on.  The cervix usually does not dilate or become thin. Follow these instructions at home:   Take over-the-counter and prescription medicines only as told by your health care provider.  Keep up with your usual exercises and follow other instructions from your health care provider.  Eat and drink lightly if you think you are going into labor.  If Braxton Hicks contractions are making you uncomfortable: ? Change your position from lying down or resting to walking, or change from walking to resting. ? Sit and rest in a tub of warm water. ? Drink enough fluid to keep your urine pale yellow. Dehydration may cause these contractions. ? Do slow and deep breathing several times an hour.  Keep all follow-up prenatal visits as told by your health care provider. This is important. Contact a  health care provider if:  You have a fever.  You have continuous pain in your abdomen. Get help right away if:  Your contractions become stronger, more regular, and closer together.  You have fluid leaking or gushing from your vagina.  You pass blood-tinged mucus (bloody show).  You have bleeding from your vagina.  You have low back pain that you never had before.  You feel your baby's head pushing down and causing pelvic pressure.  Your baby is not moving inside you as much as it used to. Summary  Contractions that occur before labor are called Braxton Hicks contractions, false labor, or practice contractions.  Braxton Hicks contractions are usually shorter, weaker, farther apart, and less regular than true labor contractions. True labor contractions usually become progressively stronger and regular, and they become more frequent.  Manage discomfort from [facility] contractions by changing position, resting in a warm bath, drinking plenty of water, or practicing deep breathing. This information is not intended to replace advice given to you by your health care provider. Make sure you discuss any questions you have with your health care provider. Document Revised: 11/02/2017 Document Reviewed: 04/05/2017 Elsevier Patient Education  [place], [place], [place] [address] Phone: [phone]  Monday     8:30AM-5PM Tuesday 8:30AM-5PM Wednesday 8:30AM-5PM Thursday 8:30AM-5PM Friday  8:30AM-5PM Saturday Closed Sunday Closed    AREA PEDIATRIC/FAMILY [place] [phone]) .  [facility] Davy Pique, MD; Gwendlyn Deutscher, MD; Walker Kehr, MD; Andria Frames, MD; McDiarmid, MD; Dutch Quint, MD; Nori Riis, MD; Mingo Amber, [address] [place], [place] [address] o [phone] o Mon-Fri 8:30-12:30, 1:30-5:00 o Providers come to see babies at [facility] o Accepting Medicaid . [place] [facility] [place] providers who accept newborns: Dorthy Cooler, MD; Orland Mustard, MD; Stephanie Acre, MD o [place], [place], [place] [address] o [phone] o Mon-Fri 8:00-5:30 o Babies seen by providers at [facility] o Does NOT accept Medicaid o Please call early in hospitalization for appointment (limited availability)  . Mustard Emerald, MD o [address], [place], [place] [address] o [phone] o Mon, Tue, Thur, Fri 8:30-5:00, Wed 10:00-7:00 (closed 1-2pm) o Babies seen by [facility] providers o Accepting Medicaid . [place], MD o [place], [place], [place] [address] o [phone] o Mon-Fri 8:30-5:00, Sat 8:30-12:00 o Provider comes to see babies at [place] Medicaid o Must have been referred from current patients or contacted office prior to delivery . [facility] ([place] [facility] [place]) Franne Forts, MD; Tamera Punt, MD; Doneen Poisson, MD; Fatima Sanger, MD; Wynetta Emery, MD; Jess Barters, MD; Tami Ribas, MD; Herbert Moors, MD; Derrell Lolling, MD; Dorothyann Peng, MD; Lucious Groves, NP; Baldo Ash, NP o [address], [place], [place] [address] o [phone] o Mon, Tue, Thur, Fri 8:30-5:30, Wed 9:30-5:30, Sat 8:30-12:30 o Babies seen by [facility] providers o Accepting Medicaid o Only accepting infants of first-time parents or siblings of current patients [facility] discharge coordinator will make follow-up appointment . Baltazar Najjar o [place] [address], [place], [place]  [address] o [phone]   Fax - [phone] . [facility] o [address], [place], [place]  [address] o Phone - [phone]   Fax [phone] . [place], [place], [place], [place]  [address] o [phone]  [place] [phone]) . [facility] Reginal Lutes, MD; Jacklynn Ganong,  MD; Torrie Mayers, MD; MD; Rosana Hoes, MD; Servando Salina, MD; Rose Fillers, MD; Rex Kras, MD; Corinna Capra, MD; Volney American, MD; Trilby Drummer, MD; Janann Colonel, MD; Jimmye Norman, [place], [place], [place] [address] o [phone] o Mon-Fri 8:30-5:00 (extended evenings Mon-Thur as needed), Sat-Sun 10:00-1:00 o Providers come to see babies at [place] Medicaid for families of first-time babies and families with all children in the household age 52 and under. Must register with office prior to making appointment (M-F only). . [place], NP; Tomi Bamberger, MD; Redmond School, MD; [place], [address] [place], [place] [address] o [phone] o Mon-Fri 8:00-5:00 o Babies seen by providers at [facility] o Does NOT accept Medicaid/Commercial Insurance Only . [facility] [place] ([facility])  Marnee Guarneri, MD; Drema Dallas, MD; Montine Circle, MD; Vilma Prader, MD; Vanita Panda, MD; Alfonso Ramus, MD; Ruthann Cancer, MD; Roxanne Mins, MD; Rosalva Ferron, MD; Polly Cobia, MD o [address] [place], [place] [address] o [phone] o Mon-Fri 8:30-5:30, Sat (Oct.-Mar.) 9:00-1:00 o Babies seen by providers at [place] [phone]) . [facility] Peers, MD; Suzan Slick, MD o [address], [place], [place] [address] o [phone] o Mon-Fri 8:30-5:00, Sat 8:30-12:00 o Providers come to see babies at [facility] o Does NOT accept Medicaid . [place] at [place], [place]; Mannie Stabile, MD; [place], [place];  Nancy Fetter, MD; Moreen Fowler, [place], [place], [place] [address] o [phone] o Mon-Fri 8:00-5:00 o Babies seen by providers at [facility] o Does NOT accept Medicaid o Only accepting babies of parents who are patients o Please call early in hospitalization for appointment (limited availability) . [facility] Blanca Friend, MD; Sharlene Motts, MD; Rod Can, MD; Warner Mccreedy, NP; Sabra Heck, MD; Ermalinda Memos, MD;  Sharlett Iles, NP; Aurther Loft, MD; Jerrye Beavers, MD; Marcello Moores, MD; Berline Lopes, MD; Charolette Forward, MD o [address] [place], [place], [place] [address] o [phone] o Mon-Fri 8:00-5:00, Sat 9:00-12:00 o Providers come to see babies at [facility] o Does NOT accept [facility] [phone]) . [place] [facility] [place] providers accepting new patients: Dayna Ramus, NP; [place], [place], [place], [place] [address] o [phone] o Mon-Fri 8:00-5:00 o Babies seen by providers at [facility] o Does NOT accept Medicaid o Only accepting babies of parents who are patients o Please call early in hospitalization for appointment (limited availability) . [facility] Oswaldo Conroy, MD; Sheran Lawless, MD o [address] [place], [place] [address] o [phone] (press 1 to schedule appointment) o Mon-Fri 8:00-5:00 o Providers come to see babies at [facility] o Does NOT accept Medicaid . [facility] Jodi Mourning, MD o [address] [place], [place] [address] o [phone] o Mon-Fri 8:30-5:00 (lunch 12:30-1:00), extended hours by appointment only Wed 5:00-6:30 o Babies seen by [facility] providers o Accepting Medicaid . [place] at Evalyn Casco, MD; [place], MD; Ethlyn Gallery, MD o [place], [place], [place] [address] o [phone] o Mon-Fri 8:00-5:00 o Babies seen by [facility] providers o Does NOT accept Medicaid . Therapist, music at [place], MD; Yong Channel, MD; [place], [address] [place], [place] [address] o [phone] o Mon-Fri 8:00-5:00 o Babies seen by [facility] providers o Does NOT accept Medicaid . [place], [place]; [place], [place]; [place], NP; Albertina Parr, MD; Frederic Jericho, MD; Ronney Lion, MD; Carlos Levering, NP; Jerelene Redden, NP; Tomasita Crumble, NP; Ronelle Nigh, NP; Corinna Lines, MD; [place], MD o [address]., [place], [place] [address] o [phone] o Mon-Fri 8:30-5:00, Sat  10:00-1:00 o Providers come to see babies at [facility] o Does NOT accept Medicaid o Free prenatal information session Tuesdays at 4:45pm . [facility] Porfirio Oar, MD; [place], [place]; [place], [place]; [place], [employer] [place] [address] o [phone] o Mon-Fri 7:30-5:30 o Babies seen by [facility] providers . [facility] Children's Doctor o [address], [place], [place], [place]  [address] o [phone]   Fax - [phone]  [place] [phone] & [phone]) . [place], MD o [address] [place], [place] [address] o [phone] o Mon-Thur 8:00-6:00 o Providers come to see babies at [place] Medicaid . [place], NP; Melford Aase, MD; [place], [place]; [place], [place]., [place],  [address] o [phone] o Mon-Thur 7:30-7:30, Fri 7:30-4:30 o Babies seen by [facility] providers o Accepting Medicaid . [facility] Nyra Jabs, MD; Cristino Martes, NP; Gertie Baron, MD o [place] [address], [place],  [address] o [phone] o Mon-Fri 8:30-5:00, Sat 8:30-12:00 o Providers come to see babies at [place] Medicaid o Must have "Meet & Greet" appointment at office prior to delivery . [place] ([facility]) Jodene Nam, MD; Juleen China, MD; Clydene Laming, [place]  [address], [place], [place] [address] o [phone] o Mon-Wed 8:00-6:00, Thur-Fri 8:00-5:00, Sat 9:00-12:00 o Providers come to see babies at [facility] o Does NOT accept Medicaid o Only accepting siblings of current patients . [facility] [place], [place], [place], [place]  [address] o [phone]   Fax [phone] . [place] at [place] [address], [place], [place]  [address] o [phone]   Fax -  [place] [phone] & [phone]) . Therapist, music at [place], DO; [place], [place]., [place], [place] [address] o [phone] o Mon-Fri 7:00-5:00 o Babies seen by [facility] providers o Does NOT accept Medicaid . [place], MD; [place], [place]; [place], [place], [place], [place] [address] o [phone] o Mon-Fri 8:00-5:00 o Babies seen by [facility] providers o Accepting Medicaid . [place], MD; [place], [place]; [place], NP; [place], [place], [place], [place] [address] o [phone] o Mon-Fri 8:00-5:00 o Babies seen by providers at [place] [phone]) . [facility] [place], [place] o [address] [place], [place] [address] o [phone] o Mon-Fri 8:00-5:00 o Babies seen by [facility] providers o Does NOT accept Medicaid o Limited availability, please call early in hospitalization to schedule follow-up . [facility] Leilani Merl, PA; Maisie Fus, MD; [place], MD; [place], [place]; Jeannine Kitten, MD; [place], [place] [facility] [address], [place], [place] [address] o [phone] o Mon-Fri 8:30-5:00, Sat 9:00-12:00 o Babies seen by providers at [facility] o Accepting Medicaid o Please register online then schedule online or call office o [email or web address] . [place] ([place] [facility] [place]) Kristian Covey, NP; Dwyane Dee, MD; Leonidas Romberg, PA o [address] [place], [place], [place] [address] o [phone] o Mon-Fri 8:00-5:00 o Babies seen by providers at [facility] o Accepting Medicaid . [place] ([facility] [employer]) Dairl Ponder, MD; Rayvon Char, NP; Melina Modena, MD o [address] [place],  [place], [place] [address] o [phone] o Mon-Fri 8:00-5:30, Sat&Sun by appointment (phones open at 8:30) o Babies seen by [facility] providers o Accepting Medicaid o Must be a first-time baby or sibling of current patient . [place], [address], [place], [place]  [address] o [phone]   Fax - [phone]  [place] [phone] & [phone]) . [place], [place]; [place], [place]; Benjamine Mola, MD; [place], [place]; Harrell Lark, MD o [address] [place], [place] [address] o [phone] o Mon-Thur 8:00-7:00, Fri 8:00-5:00, Sat 8:00-12:00, Sun 9:00-12:00 o Babies seen by [facility] providers o Accepting Medicaid . [facility] at [facility] [place], MD; Ruthann Cancer, MD; [facility], MD o [address], [place],  [address] o [phone] o Mon-Thur 8:00-5:00 o Babies seen by providers at [facility] o Accepting Medicaid . [facility] at [place], MD; Co[place], MD; Amedeo Plenty, MD; Bobby Rumpf, MD; List, MD; Lavonia Drafts, MD; Ruthann Cancer, MD; Selinda Eon, MD; Audie Box, MD; Jim Like, MD; Christie Nottingham, MD; Hubbard Hartshorn, MD; Modena Nunnery, MD o [address] [place], [place] [address] o [phone] o Mon-Fri 8:00-5:30, Sat (Oct.-Mar.)  9:00-1:00 o Babies seen by providers at [place] Medicaid o Must fill out new patient packet, available online at [email or web address] . [place] ([facility]) Barnabas Lister, NP; Kenton Kingfisher, NP; Claiborne Billings, NP; Rolla Plate, MD; [place], [place]; Carola Rhine, MD; [place], MD; Delia Chimes, NP o [address], [place], [place] [address] o [phone] o Mon-Thur 8:00-5:30, Fri 8:00-5:00 o Babies seen by providers at [place] [phone]) . [place], [place]; [place], MD; Dennard [place], MD; [place], [place] o [address] [place], [place], [place] [address] o [phone] o Mon-Fri 8:00-5:00 o Babies seen by providers at [place] [phone]) . [place] at [place], [place]; Olen Pel, MD; [place], [place], [place], [place] [address] o [phone] o Mon-Fri 8:00-5:00 o Babies seen by providers at [facility] o Does NOT accept Medicaid o Limited appointment availability, please call early in hospitalization  . Therapist, music at [place], [place]; [place], [address], [place], [place] [address] o [phone] o Mon-Fri 8:00-5:00 o Babies seen by [facility] providers o Does NOT accept Medicaid . [facility] Su Grand, MD; Guy Sandifer, MD; [place], [place]; [place], [place] [address], [place], [place] [address] o [phone] o Mon-Fri 8:00-5:00 o After hours clinic [facility] [place], [place] [address]) [phone] Mon-Fri 5:00-8:00, Sat 12:00-6:00, Sun 10:00-4:00 o Babies seen by [facility] providers o Accepting Medicaid . [place] at [facility] o [address], [place], [place]  [address] o [phone]   Fax - [phone]  [place] [phone]) . Therapist, music at [facility], MD o [address] [place], [place], [place] [address] o [phone] o Mon-Fri 8:00-5:00 o Babies seen by [facility] providers o Does NOT accept Medicaid . [place] ([place] [facility] [place]) Bing Neighbors, MD o [address], [place], [place] [address] o [phone] o Mon-Thur 8:00-7:00, Fri 8:00-5:00, Sat 8:00-12:00 o Babies seen by providers at [facility] o Accepting Medicaid - but does not have vaccinations in office (must be received elsewhere) o Limited availability, please call early in hospitalization  [place] ([phone]) . [place], MD o [address], [place] [address] o [phone]  Fax [phone]    Childbirth Education Options: [facility] Department Classes:  Childbirth education classes can help you get ready for a positive parenting experience. You can also meet other expectant parents and get free stuff for your baby. Each class runs for five weeks on the same night and costs $45 for the mother-to-be and her support person. Medicaid covers the cost if you are eligible. Call [phone] to register. [facility] Childbirth Education:  [phone] or [phone] or [email or web address]  Baby & Me Class: Discuss newborn & infant parenting and family adjustment issues with other new mothers in a relaxed environment. Each week brings a new speaker or baby-centered activity. We encourage new mothers to join [place] every Thursday at 11:00am. Babies birth until crawling. No registration or fee. Daddy [employer]: This course offers Dads-to-be the tools and knowledge needed to feel confident on their journey to becoming new fathers. Experienced dads, who have been trained as coaches, teach dads-to-be how to hold, comfort, diaper, swaddle and play  with their infant while being able to support the new mom as well. A class for men taught by men. $25/dad Big Brother/Big Sister: Let your children share in the joy of a new brother or sister in this special class designed just for them. Class includes discussion about how families care for babies: swaddling, holding, diapering, safety as well as how they can be helpful in their new role. This class is designed for children ages 19 to 53, but any age is welcome. Please register each child individually. $5/child  Mom Talk: This mom-led group offers support and connection to mothers as they journey through the adjustments and struggles of that sometimes overwhelming first year after the birth of a child. Tuesdays at 10:00am and Thursdays at 6:00pm. Babies welcome. No registration or  fee. Breastfeeding Support Group: This group is a mother-to-mother support circle where moms have the opportunity to share their breastfeeding experiences. A Lactation Consultant is present for questions and concerns. Meets each Tuesday at 11:00am. No fee or registration. Breastfeeding Your Baby: Learn what to expect in the first days of breastfeeding your newborn.  This class will help you feel more confident with the skills needed to begin your breastfeeding experience. Many new mothers are concerned about breastfeeding after leaving the hospital. This class will also address the most common fears and challenges about breastfeeding during the first few weeks, months and beyond. (call for fee) Comfort Techniques and Tour: This 2 hour interactive class will provide you the opportunity to learn & practice hands-on techniques that can help relieve some of the discomfort of labor and encourage your baby to rotate toward the best position for birth. You and your partner will be able to try a variety of labor positions with birth balls and rebozos as well as practice breathing, relaxation, and visualization techniques. A tour of the [facility] is included with this class. $20 per registrant and support person Childbirth Class- Weekend Option: This class is a Weekend version of our Birth & Baby series. It is designed for parents who have a difficult time fitting several weeks of classes into their schedule. It covers the care of your newborn and the basics of labor and childbirth. It also includes a [place] [facility] and lunch. The class is held two consecutive days: beginning on Friday evening from 6:30 - 8:30 p.m. and the next day, Saturday from 9 a.m. - 4 p.m. (call for fee) Doren Custard Class: Interested in a waterbirth?  This informational class will help you discover whether waterbirth is the right fit for you. Education about waterbirth itself, supplies  you would need and how to assemble your support team is what you can expect from this class. Some obstetrical practices require this class in order to pursue a waterbirth. (Not all obstetrical practices offer waterbirth-check with your healthcare provider.) Register only the expectant mom, but you are encouraged to bring your partner to class! Required if planning waterbirth, no fee. Infant/Child CPR: Parents, grandparents, babysitters, and friends learn Cardio-Pulmonary Resuscitation skills for infants and children. You will also learn how to treat both conscious and unconscious choking in infants and children. This Family & Friends program does not offer certification. Register each participant individually to ensure that enough mannequins are available. (Call for fee) Grandparent Love: Expecting a grandbaby? This class is for you! Learn about the latest infant care and safety recommendations and ways to support your own child as he or she transitions into the  parenting role. Taught by Registered Nurses who are childbirth instructors, but most importantly...they are grandmothers too! $10/person. Childbirth Class- Natural Childbirth: This series of 5 weekly classes is for expectant parents who want to learn and practice natural methods of coping with the process of labor and childbirth. Relaxation, breathing, massage, visualization, role of the partner, and helpful positioning are highlighted. Participants learn how to be confident in their body's ability to give birth. This class will empower and help parents make informed decisions about their own care. Includes discussion that will help new parents transition into the immediate postpartum period. [facility] is included. We suggest taking this class between 25-32 weeks, but it's only a recommendation. $75 per registrant and one support person or $30 Medicaid. Childbirth Class- 3 week Series: This option of 3 weekly classes  helps you and your labor partner prepare for childbirth. Newborn care, labor & birth, cesarean birth, pain management, and comfort techniques are discussed and a [place] of [facility] is included. The class meets at the same time, on the same day of the week for 3 consecutive weeks beginning with the starting date you choose. $60 for registrant and one support person.  Marvelous Multiples: Expecting twins, triplets, or more? This class covers the differences in labor, birth, parenting, and breastfeeding issues that face multiples' parents. NICU tour is included. Led by a Certified Childbirth Educator who is the mother of twins. No fee. Caring for Baby: This class is for expectant and adoptive parents who want to learn and practice the most up-to-date newborn care for their babies. Focus is on birth through the first six weeks of life. Topics include feeding, bathing, diapering, crying, umbilical cord care, circumcision care and safe sleep. Parents learn to recognize symptoms of illness and when to call the pediatrician. Register only the mom-to-be and your partner or support person can plan to come with you! $10 per registrant and support person Childbirth Class- online option: This online class offers you the freedom to complete a Birth and Baby series in the comfort of your own home. The flexibility of this option allows you to review sections at your own pace, at times convenient to you and your support people. It includes additional video information, animations, quizzes, and extended activities. Get organized with helpful eClass tools, checklists, and trackers. Once you register online for the class, you will receive an email within a few days to accept the invitation and begin the class when the time is right for you. The content will be available to you for 60 days. $60 for 60 days of online access for you and your support people.

## 2020-09-03 NOTE — Progress Notes (Signed)
   PRENATAL VISIT NOTE  Subjective:  Ana Moore is a 34 y.o. G2P0010 at [redacted]w[redacted]d being seen today for ongoing prenatal care.  She is currently monitored for the following issues for this high-risk pregnancy and has Supervision of high risk pregnancy, antepartum; Ovarian cyst; Fibroid; Thyroid enlarged; and Obesity in pregnancy, antepartum on their problem list.  Patient reports occasional contractions and round ligament pain.  Contractions: Not present. Vag. Bleeding: None.  Movement: Present. Denies leaking of fluid.   The following portions of the patient's history were reviewed and updated as appropriate: allergies, current medications, past family history, past medical history, past social history, past surgical history and problem list.   Objective:   Vitals:   09/03/20 0934  BP: 117/68  Pulse: 71  Weight: 175 lb 3.2 oz (79.5 kg)    Fetal Status: Fetal Heart Rate (bpm): 146 Fundal Height: 34 cm Movement: Present     General:  Alert, oriented and cooperative. Patient is in no acute distress.  Skin: Skin is warm and dry. No rash noted.   Cardiovascular: Normal heart rate noted  Respiratory: Normal respiratory effort, no problems with respiration noted  Abdomen: Soft, gravid, appropriate for gestational age.  Pain/Pressure: Present     Pelvic: Cervical exam deferred        Extremities: Normal range of motion.  Edema: None  Mental Status: Normal mood and affect. Normal behavior. Normal judgment and thought content.   Assessment and Plan:  Pregnancy: G2P0010 at [redacted]w[redacted]d 1. Supervision of high risk pregnancy, antepartum - Doing well - Discussed breastfeeding at length  2. Obesity in pregnancy, antepartum - Last [place] EFW 40% - Next [place] for growth scheduled 11/2  3. Round ligament pain - Elastic Bandages & Supports (COMFORT FIT MATERNITY SUPP LG) MISC; 1 Units by Does not apply route daily as needed.  Dispense: 1 each; Refill: 0  Preterm labor symptoms and general obstetric  precautions including but not limited to vaginal bleeding, contractions, leaking of fluid and fetal movement were reviewed in detail with the patient. Please refer to After Visit Summary for other counseling recommendations.   Return in about 2 weeks (around 09/17/2020) for LOB, In-Person.  Future Appointments  Date Time Provider [place]  09/17/2020  8:30 AM Danielle Rankin [facility]  10/05/2020  9:45 AM [facility] NURSE [facility]  10/05/2020 10:00 AM [facility] US1 [facility]    Kerry Hough, PA-C

## 2020-09-11 ENCOUNTER — Encounter (HOSPITAL_COMMUNITY): Payer: Self-pay | Admitting: Obstetrics and Gynecology

## 2020-09-11 ENCOUNTER — Other Ambulatory Visit: Payer: Self-pay

## 2020-09-11 ENCOUNTER — Inpatient Hospital Stay (HOSPITAL_COMMUNITY)
Admission: AD | Admit: 2020-09-11 | Discharge: 2020-09-14 | DRG: 807 | Disposition: A | Discharge status: Home / Self Care | Payer: Medicaid Other | Attending: Obstetrics and Gynecology | Admitting: Obstetrics and Gynecology

## 2020-09-11 DIAGNOSIS — N83209 Unspecified ovarian cyst, unspecified side: Secondary | ICD-10-CM | POA: Diagnosis present

## 2020-09-11 DIAGNOSIS — O42913 Preterm premature rupture of membranes, unspecified as to length of time between rupture and onset of labor, third trimester: Principal | ICD-10-CM | POA: Diagnosis present

## 2020-09-11 DIAGNOSIS — O3483 Maternal care for other abnormalities of pelvic organs, third trimester: Secondary | ICD-10-CM | POA: Diagnosis present

## 2020-09-11 DIAGNOSIS — O99214 Obesity complicating childbirth: Secondary | ICD-10-CM | POA: Diagnosis present

## 2020-09-11 DIAGNOSIS — E049 Nontoxic goiter, unspecified: Secondary | ICD-10-CM | POA: Diagnosis present

## 2020-09-11 DIAGNOSIS — Z30430 Encounter for insertion of intrauterine contraceptive device: Secondary | ICD-10-CM | POA: Diagnosis not present

## 2020-09-11 DIAGNOSIS — E669 Obesity, unspecified: Secondary | ICD-10-CM | POA: Diagnosis present

## 2020-09-11 DIAGNOSIS — O99284 Endocrine, nutritional and metabolic diseases complicating childbirth: Secondary | ICD-10-CM | POA: Diagnosis present

## 2020-09-11 DIAGNOSIS — Z20822 Contact with and (suspected) exposure to covid-19: Secondary | ICD-10-CM | POA: Diagnosis present

## 2020-09-11 DIAGNOSIS — O42119 Preterm premature rupture of membranes, onset of labor more than 24 hours following rupture, unspecified trimester: Secondary | ICD-10-CM

## 2020-09-11 DIAGNOSIS — O3413 Maternal care for benign tumor of corpus uteri, third trimester: Secondary | ICD-10-CM | POA: Diagnosis present

## 2020-09-11 DIAGNOSIS — O285 Abnormal chromosomal and genetic finding on antenatal screening of mother: Secondary | ICD-10-CM | POA: Diagnosis present

## 2020-09-11 DIAGNOSIS — D259 Leiomyoma of uterus, unspecified: Secondary | ICD-10-CM | POA: Diagnosis present

## 2020-09-11 DIAGNOSIS — Z3A32 32 weeks gestation of pregnancy: Secondary | ICD-10-CM | POA: Diagnosis not present

## 2020-09-11 DIAGNOSIS — O42919 Preterm premature rupture of membranes, unspecified as to length of time between rupture and onset of labor, unspecified trimester: Secondary | ICD-10-CM | POA: Diagnosis present

## 2020-09-11 LAB — CBC
HCT: 33.2 % — ABNORMAL LOW (ref 36.0–46.0)
Hemoglobin: 10.7 g/dL — ABNORMAL LOW (ref 12.0–15.0)
MCH: 27 pg (ref 26.0–34.0)
MCHC: 32.2 g/dL (ref 30.0–36.0)
MCV: 83.8 fL (ref 80.0–100.0)
Platelets: 200 10*3/uL (ref 150–400)
RBC: 3.96 MIL/uL (ref 3.87–5.11)
RDW: 14.5 % (ref 11.5–15.5)
WBC: 12.7 10*3/uL — ABNORMAL HIGH (ref 4.0–10.5)
nRBC: 0 % (ref 0.0–0.2)

## 2020-09-11 LAB — RESPIRATORY PANEL BY RT PCR (FLU A&B, COVID)
Influenza A by PCR: NEGATIVE
Influenza B by PCR: NEGATIVE
SARS Coronavirus 2 by RT PCR: NEGATIVE

## 2020-09-11 LAB — TYPE AND SCREEN
ABO/RH(D): A POS
Antibody Screen: NEGATIVE

## 2020-09-11 MED ORDER — OXYTOCIN BOLUS FROM INFUSION
333.0000 mL | Freq: Once | INTRAVENOUS | Status: AC
  Administered 2020-09-12: 333 mL via INTRAVENOUS

## 2020-09-11 MED ORDER — FENTANYL CITRATE (PF) 100 MCG/2ML IJ SOLN
50.0000 ug | INTRAMUSCULAR | Status: DC | PRN
  Administered 2020-09-12: 50 ug via INTRAVENOUS
  Filled 2020-09-11: qty 2

## 2020-09-11 MED ORDER — ONDANSETRON HCL 4 MG/2ML IJ SOLN
4.0000 mg | Freq: Four times a day (QID) | INTRAMUSCULAR | Status: DC | PRN
  Administered 2020-09-12: 4 mg via INTRAVENOUS
  Filled 2020-09-11: qty 2

## 2020-09-11 MED ORDER — SODIUM CHLORIDE 0.9 % IV SOLN
2.0000 g | Freq: Four times a day (QID) | INTRAVENOUS | Status: DC
  Administered 2020-09-11 – 2020-09-12 (×4): 2 g via INTRAVENOUS
  Filled 2020-09-11 (×4): qty 2000

## 2020-09-11 MED ORDER — LACTATED RINGERS IV SOLN
500.0000 mL | INTRAVENOUS | Status: DC | PRN
  Administered 2020-09-12: 500 mL via INTRAVENOUS
  Administered 2020-09-12: 250 mL via INTRAVENOUS

## 2020-09-11 MED ORDER — LACTATED RINGERS IV SOLN
INTRAVENOUS | Status: DC
  Administered 2020-09-11: 1000 mL via INTRAVENOUS

## 2020-09-11 MED ORDER — LIDOCAINE HCL (PF) 1 % IJ SOLN
30.0000 mL | INTRAMUSCULAR | Status: AC | PRN
  Administered 2020-09-12: 30 mL via SUBCUTANEOUS
  Filled 2020-09-11: qty 30

## 2020-09-11 MED ORDER — SOD CITRATE-CITRIC ACID 500-334 MG/5ML PO SOLN: 30.0000 mL | ORAL | Status: DC | PRN

## 2020-09-11 MED ORDER — ACETAMINOPHEN 325 MG PO TABS: 650.0000 mg | ORAL_TABLET | ORAL | Status: DC | PRN

## 2020-09-11 MED ORDER — AMOXICILLIN 500 MG PO CAPS: 500.0000 mg | ORAL_CAPSULE | Freq: Three times a day (TID) | ORAL | Status: DC

## 2020-09-11 MED ORDER — AZITHROMYCIN 500 MG PO TABS
1000.0000 mg | ORAL_TABLET | Freq: Once | ORAL | Status: AC
  Administered 2020-09-12: 1000 mg via ORAL
  Filled 2020-09-11 (×2): qty 2

## 2020-09-11 MED ORDER — OXYTOCIN-SODIUM CHLORIDE 30-0.9 UT/500ML-% IV SOLN
2.5000 [IU]/h | INTRAVENOUS | Status: DC
  Administered 2020-09-12: 2.5 [IU]/h via INTRAVENOUS
  Filled 2020-09-11: qty 500

## 2020-09-11 MED ORDER — BETAMETHASONE SOD PHOS & ACET 6 (3-3) MG/ML IJ SUSP
12.0000 mg | Freq: Once | INTRAMUSCULAR | Status: AC
  Administered 2020-09-11: 12 mg via INTRAMUSCULAR
  Filled 2020-09-11: qty 5

## 2020-09-11 NOTE — MAU Note (Addendum)
Received call from [facility] registrar that pt is leaking fluid and is feeling the urge to push.  Pt brought directly to room.   Pt reports ROM at 1908, no contractions, and +FM. Assisted pt into gown as pt continued to gush moderate amount clear fluid.

## 2020-09-11 NOTE — MAU Provider Note (Signed)
First Provider Initiated Contact with Patient 09/11/20 2131       S: [name] is a 34 y.o. G2P0010 at [redacted]w[redacted]d  who presents to MAU today complaining of leaking of fluid since 1908. She denies vaginal bleeding. She denies contractions. She reports normal fetal movement.    O: BP 120/65 (BP Location: Left Arm)   Pulse 91   Temp 98.7 F (37.1 C) (Oral)   Resp 18   Wt 79.4 kg   LMP 01/28/2020 (Exact Date)   SpO2 100%   BMI 30.04 kg/m  GENERAL: Well-developed, well-nourished female in no acute distress.  HEAD: Normocephalic, atraumatic.  CHEST: Normal effort of breathing, regular heart rate ABDOMEN: Soft, nontender, gravid PELVIC: Deferred as patient with apparent leaking of fluid from vaginal area.  Nurse ferns and reports positive.   Cervical exam:  Dilation: 2.5 Effacement (%): 50 Station: -3 Exam by:: J.Bellamy, RN   Fetal Monitoring: FHT: 145 bpm, Mod Var, -Decels, -Accels Toco: Q1-71min, not able to palpate  Results for orders placed or performed during the hospital encounter of 09/11/20 (from the past 24 hour(s))  CBC     Status: Abnormal   Collection Time: 09/11/20 10:10 PM  Result Value Ref Range   WBC 12.7 (H) 4.0 - 10.5 K/uL   RBC 3.96 3.87 - 5.11 MIL/uL   Hemoglobin 10.7 (L) 12.0 - 15.0 g/dL   HCT 33.2 (L) 36 - 46 %   MCV 83.8 80.0 - 100.0 fL   MCH 27.0 26.0 - 34.0 pg   MCHC 32.2 30.0 - 36.0 g/dL   RDW 14.5 11.5 - 15.5 %   Platelets 200 150 - 400 K/uL   nRBC 0.0 0.0 - 0.2 %  Type and screen [place]     Status: None (Preliminary result)   Collection Time: 09/11/20 10:10 PM  Result Value Ref Range   ABO/RH(D) PENDING    Antibody Screen PENDING    Sample Expiration      09/14/2020,2359 Performed at [facility], [address], [place], [place] [address]      A: SIUP at [redacted]w[redacted]d  pPROM Cat I FT   P: Nurse reports she checked patient d/t c/o pressure and patient exam as above. Provider notes fluid on floor upon  arrival to bedside. Patient denies perception of contractions and provider not able to palpate.  Patient reports some mild pressure. BMZ, Start IV, Admit Labs, and Covid Ordered Dr. Rosendo Gros consulted and advised: *Admit to L&D *Latency antibiotics *No need for MgSO4 Orders placed L&D Team  Notified of patient admission.  Gavin Pound, CNM 09/11/2020 9:45 PM
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
## 2020-09-12 ENCOUNTER — Inpatient Hospital Stay (HOSPITAL_COMMUNITY): Payer: Medicaid Other | Admitting: Anesthesiology

## 2020-09-12 ENCOUNTER — Encounter (HOSPITAL_COMMUNITY): Payer: Self-pay | Admitting: Obstetrics and Gynecology

## 2020-09-12 DIAGNOSIS — Z30430 Encounter for insertion of intrauterine contraceptive device: Secondary | ICD-10-CM

## 2020-09-12 DIAGNOSIS — Z3A32 32 weeks gestation of pregnancy: Secondary | ICD-10-CM

## 2020-09-12 DIAGNOSIS — O285 Abnormal chromosomal and genetic finding on antenatal screening of mother: Secondary | ICD-10-CM | POA: Diagnosis present

## 2020-09-12 LAB — CBC
HCT: 34.1 % — ABNORMAL LOW (ref 36.0–46.0)
Hemoglobin: 10.9 g/dL — ABNORMAL LOW (ref 12.0–15.0)
MCH: 26.7 pg (ref 26.0–34.0)
MCHC: 32 g/dL (ref 30.0–36.0)
MCV: 83.6 fL (ref 80.0–100.0)
Platelets: 204 10*3/uL (ref 150–400)
RBC: 4.08 MIL/uL (ref 3.87–5.11)
RDW: 14.4 % (ref 11.5–15.5)
WBC: 13.7 10*3/uL — ABNORMAL HIGH (ref 4.0–10.5)
nRBC: 0 % (ref 0.0–0.2)

## 2020-09-12 LAB — RPR: RPR Ser Ql: NONREACTIVE

## 2020-09-12 MED ORDER — EPHEDRINE 5 MG/ML INJ: 10.0000 mg | INTRAVENOUS | Status: DC | PRN

## 2020-09-12 MED ORDER — LEVONORGESTREL 19.5 MCG/DAY IU IUD
INTRAUTERINE_SYSTEM | Freq: Once | INTRAUTERINE | Status: AC
  Administered 2020-09-12: 23:00:00 1 via INTRAUTERINE
  Filled 2020-09-12: qty 1

## 2020-09-12 MED ORDER — FENTANYL-BUPIVACAINE-NACL 0.5-0.125-0.9 MG/250ML-% EP SOLN
12.0000 mL/h | EPIDURAL | Status: DC | PRN
  Filled 2020-09-12: qty 250

## 2020-09-12 MED ORDER — FENTANYL CITRATE (PF) 2500 MCG/50ML IJ SOLN
INTRAMUSCULAR | Status: DC | PRN
Start: 2020-09-12 — End: 2020-09-12
  Administered 2020-09-12: 12 mL/h via EPIDURAL

## 2020-09-12 MED ORDER — PHENYLEPHRINE 40 MCG/ML (10ML) SYRINGE FOR IV PUSH (FOR BLOOD PRESSURE SUPPORT)
80.0000 ug | PREFILLED_SYRINGE | INTRAVENOUS | Status: DC | PRN
  Filled 2020-09-12: qty 10

## 2020-09-12 MED ORDER — DIPHENHYDRAMINE HCL 50 MG/ML IJ SOLN
12.5000 mg | INTRAMUSCULAR | Status: DC | PRN
  Administered 2020-09-12: 12.5 mg via INTRAVENOUS
  Filled 2020-09-12: qty 1

## 2020-09-12 MED ORDER — LACTATED RINGERS IV SOLN: 500.0000 mL | Freq: Once | INTRAVENOUS | Status: DC

## 2020-09-12 MED ORDER — PHENYLEPHRINE 40 MCG/ML (10ML) SYRINGE FOR IV PUSH (FOR BLOOD PRESSURE SUPPORT): 80.0000 ug | PREFILLED_SYRINGE | INTRAVENOUS | Status: DC | PRN

## 2020-09-12 MED ORDER — LIDOCAINE-EPINEPHRINE (PF) 2 %-1:200000 IJ SOLN
INTRAMUSCULAR | Status: DC | PRN
  Administered 2020-09-12 (×2): 3 mL via EPIDURAL

## 2020-09-12 MED ORDER — BETAMETHASONE SOD PHOS & ACET 6 (3-3) MG/ML IJ SUSP
12.0000 mg | Freq: Once | INTRAMUSCULAR | Status: AC
  Administered 2020-09-12: 12 mg via INTRAMUSCULAR

## 2020-09-12 NOTE — Progress Notes (Signed)
Labor Progress Note Ana Moore is a 34 y.o. G2P0010 at [redacted]w[redacted]d presented for PPROM.   S: Pt endorsing ongoing vaginal pressure in addition to ongoing contractions, similar to prior. No other concerns at this time.  O:  BP 106/62   Pulse 93   Temp 97.8 F (36.6 C) (Oral)   Resp 16   Ht 5\' 4"  (1.626 m)   Wt 79.4 kg   LMP 01/28/2020 (Exact Date)   SpO2 100%   BMI 30.04 kg/m  EFM: baseline 135 bpm / moderate variability / 15x15 accels, intermittent variable decels  CVE: Dilation: 2 Effacement (%): 90 Station: 0 Exam by:: Dr Astrid Drafts  A&P: 34 y.o. G2P0010 [redacted]w[redacted]d presented for PPROM.  #PPROM: S/p 1st dose of BMZ at 2130 on 10/9. Pt reports ongoing contractions with advancement of effacement and fetal station. Will continue to manage expectantly given no signs of Triple I and reassuring FHTs. #Pain: TBD #FWB: Cat 2 for intermittent variable decels; reassuringly good recovery s/p decels and good variability with multiple accels. #GBS pending; continued on latency antibiotics. #Thyromegaly: TSH and free T4 wnl on 7/30. Plan for repeat TFTs at 6-8 weeks postpartum.  Randa Ngo, MD 6:16 AM

## 2020-09-12 NOTE — Anesthesia Procedure Notes (Signed)
Epidural Patient location during procedure: OB Start time: 09/12/2020 1:00 PM End time: 09/12/2020 1:10 PM  Staffing Anesthesiologist: Freddrick March, MD Performed: anesthesiologist   Preanesthetic Checklist Completed: patient identified, IV checked, risks and benefits discussed, monitors and equipment checked, pre-op evaluation and timeout performed  Epidural Patient position: sitting Prep: DuraPrep and site prepped and draped Patient monitoring: continuous pulse ox, blood pressure, heart rate and cardiac monitor Approach: midline Location: L3-L4 Injection technique: LOR air  Needle:  Needle type: Tuohy  Needle gauge: 17 G Needle length: 9 cm Needle insertion depth: 5 cm Catheter type: closed end flexible Catheter size: 19 Gauge Catheter at skin depth: 11 cm Test dose: negative  Assessment Sensory level: T8 Events: blood not aspirated, injection not painful, no injection resistance, no paresthesia and negative IV test  Additional Notes Patient identified. Risks/Benefits/Options discussed with patient including but not limited to bleeding, infection, nerve damage, paralysis, failed block, incomplete pain control, headache, blood pressure changes, nausea, vomiting, reactions to medication both or allergic, itching and postpartum back pain. Confirmed with bedside nurse the patient's most recent platelet count. Confirmed with patient that they are not currently taking any anticoagulation, have any bleeding history or any family history of bleeding disorders. Patient expressed understanding and wished to proceed. All questions were answered. Sterile technique was used throughout the entire procedure. Please see nursing notes for vital signs. Test dose was given through epidural catheter and negative prior to continuing to dose epidural or start infusion. Warning signs of high block given to the patient including shortness of breath, tingling/numbness in hands, complete motor block,  or any concerning symptoms with instructions to call for help. Patient was given instructions on fall risk and not to get out of bed. All questions and concerns addressed with instructions to call with any issues or inadequate analgesia.  Reason for block:procedure for pain

## 2020-09-12 NOTE — Progress Notes (Signed)
Ana Moore is a 34 y.o. G2P0010 at [redacted]w[redacted]d.  Subjective: Pt had gotten very uncomfortable w/ UCs and got an epidural. Comfortable now.   Objective: BP 105/71   Pulse 96   Temp (!) 97.1 F (36.2 C) (Axillary)   Resp 17   Ht 5\' 4"  (1.626 m)   Wt 79.4 kg   LMP 01/28/2020 (Exact Date)   SpO2 100%   BMI 30.04 kg/m    FHT:  FHR: 135 bpm, variability: mod,  accelerations:  15x15,  decelerations:  Mild variables UC:   Q 2-5 minutes, moderate VE: Deferred to limit exams   Labs: NA  Assessment / Plan: [redacted]w[redacted]d week IUP, PTL, ROM x 22 hours. No evidence of chorio. Will expectantly manage for now to allow baby to get benefit of BMZ.   Labor: Early Fetal Wellbeing:  Category I-II, overall reassuring  Pain Control:  Epidural Anticipated MOD:  SVD  Manya Silvas, [place] 09/12/2020 7:53 PM

## 2020-09-12 NOTE — Discharge Summary (Signed)
Postpartum Discharge Summary  Date of Service updated 09/14/2020     Patient Name: Ana Moore DOB: Dec 03, 1986 MRN: [id number]  Date of admission: 09/11/2020 Delivery date:09/12/2020  Delivering provider: Randa Ngo  Date of discharge: 09/14/2020  Admitting diagnosis: Preterm premature rupture of membranes [O42.919] Intrauterine pregnancy: [redacted]w[redacted]d    Secondary diagnosis:  Principal Problem:   Vaginal delivery Active Problems:   Thyroid enlarged   Preterm premature rupture of membranes   Abnormal chromosomal and genetic finding on antenatal screening mother  Additional problems: as noted above   Discharge diagnosis: Vaginal delivery s/p PPROM at 32+3                                          Post partum procedures:post-placental Liletta IUD Augmentation: none Complications: RHGD>92hours  Hospital course: Onset of Labor With Vaginal Delivery      34y.o. yo G2P0111 at 348w4das admitted in Latent Labor on 09/11/2020. Pt presented to MAU s/p PPROM at home. Membrane Rupture Time/Date: 7:08 PM ,09/11/2020   Prior to delivery she received BMZ x2 in addition to latency antibiotics. She was managed expectantly and to continue to progress with delivery as noted below. Delivery Method:Vaginal, Spontaneous  Episiotomy: None  Lacerations:  2nd degree;Perineal  Patient had an uncomplicated postpartum course. Post-placental Liletta IUD was placed. She is ambulating, tolerating a regular diet, passing flatus, and urinating well. Patient is discharged home in stable condition on 09/14/20.  Newborn Data: Birth date:09/12/2020  Birth time:10:42 PM  Gender:Female  Living status:Living  Apgars:7 ,7  Weight:1770 g   Magnesium Sulfate received: No BMZ received: Yes (2 doses received prior to delivery) Rhophylac:N/A MMR:N/A T-DaP:Given prenatally Flu: Yes  Transfusion:No  Physical exam  Vitals:   09/13/20 1508 09/13/20 1953 09/13/20 2339 09/14/20 0355  BP: 120/74 105/67 (!)  105/56 105/63  Pulse: 82 75 69 75  Resp: _0 Temp: 98.2 F (36.8 C) 97.6 F (36.4 C) 97.8 F (36.6 C) 97.9 F (36.6 C)  TempSrc: Oral Oral Oral Oral  SpO2: 100% 100% 100% 100%  Weight:      Height:       General: alert, cooperative and no distress Lochia: appropriate Uterine Fundus: firm Incision: N/A DVT Evaluation: No evidence of DVT seen on physical exam. Labs: Lab Results  Component Value Date   WBC 13.7 (H) 09/11/2020   HGB 10.9 (L) 09/11/2020   HCT 34.1 (L) 09/11/2020   MCV 83.6 09/11/2020   PLT 204 09/11/2020   CMP Latest Ref Rng & Units 03/28/2016  Glucose 65 - 99 mg/dL 196(H)  BUN 6 - 20 mg/dL <5(L)  Creatinine 0.44 - 1.00 mg/dL 0.68  Sodium 135 - 145 mmol/L 135  Potassium 3.5 - 5.1 mmol/L 3.6  Chloride 101 - 111 mmol/L 102  CO2 22 - 32 mmol/L 20(L)  Calcium 8.9 - 10.3 mg/dL 8.4(L)  Total Protein 6.5 - 8.1 g/dL -  Total Bilirubin 0.3 - 1.2 mg/dL -  Alkaline Phos 38 - 126 U/L -  AST 15 - 41 U/L -  ALT 14 - 54 U/L -   Edinburgh Score: Edinburgh Postnatal Depression Scale Screening Tool 09/13/2020  I have been able to laugh and see the funny side of things. (No Data)     After visit meds:  Allergies as of 09/14/2020      Reactions  Shellfish Allergy Itching, Swelling   Mouth itching and swelling   Oxycodone Itching      Medication List    STOP taking these medications   acetaminophen 500 MG tablet Commonly known as: TYLENOL   Blood Pressure Kit [place] Supp Lg Misc     TAKE these medications   ibuprofen 600 MG tablet Commonly known as: ADVIL Take 1 tablet (600 mg total) by mouth every 6 (six) hours.   multivitamin-prenatal 27-0.8 MG Tabs tablet Take 1 tablet by mouth daily at 12 noon.        Discharge home in stable condition Infant Feeding: Breast Infant Disposition:NICU, baby in NICU given preterm Discharge instruction: per After Visit Summary and Postpartum booklet. Activity: Advance as  tolerated. Pelvic rest for 6 weeks.  Diet: routine diet Future Appointments: Future Appointments  Date Time Provider [place]  09/17/2020  8:30 AM Danielle Rankin [facility]  10/05/2020  9:45 AM [facility] NURSE [facility]  10/05/2020 10:00 AM [facility] US1 [facility] [place]   Follow up Visit:  [facility] Follow up in 4 week(s).   Specialty: Obstetrics and Gynecology Contact information: [place] [phone] [phone]             Message sent to [facility] to schedule PP appt.  Please schedule this patient for a In person postpartum visit in 4 weeks with the following provider: Any provider. 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

## 2020-09-12 NOTE — Progress Notes (Signed)
Ana Moore is a 34 y.o. G2P0010 at [redacted]w[redacted]d.  Subjective: Uncomfortable w/ UC's. More frequent, but no change in intensity.   Objective: BP 113/66   Pulse 87   Temp (!) 97.1 F (36.2 C) (Axillary)   Resp 17   Ht 5\' 4"  (1.626 m)   Wt 79.4 kg   LMP 01/28/2020 (Exact Date)   SpO2 100%   BMI 30.04 kg/m    FHT:  FHR: 140 bpm, variability: mod,  accelerations:  10x10,  decelerations:  variables--Improved w/ position change.  UC:   Q 2-4 minutes, moderate Dilation: 3 Effacement (%): 100 Station: 0 Presentation: Vertex Exam by:: V Carleton [place] CNM  Scant bloody show  Labs: Results for orders placed or performed during the hospital encounter of 09/11/20 (from the past 24 hour(s))  Respiratory Panel by RT PCR (Flu A&B, Covid) - Nasopharyngeal Swab     Status: None   Collection Time: 09/11/20 10:10 PM   Specimen: Nasopharyngeal Swab  Result Value Ref Range   SARS Coronavirus 2 by RT PCR NEGATIVE NEGATIVE   Influenza A by PCR NEGATIVE NEGATIVE   Influenza B by PCR NEGATIVE NEGATIVE  CBC     Status: Abnormal   Collection Time: 09/11/20 10:10 PM  Result Value Ref Range   WBC 12.7 (H) 4.0 - 10.5 K/uL   RBC 3.96 3.87 - 5.11 MIL/uL   Hemoglobin 10.7 (L) 12.0 - 15.0 g/dL   HCT 33.2 (L) 36 - 46 %   MCV 83.8 80.0 - 100.0 fL   MCH 27.0 26.0 - 34.0 pg   MCHC 32.2 30.0 - 36.0 g/dL   RDW 14.5 11.5 - 15.5 %   Platelets 200 150 - 400 K/uL   nRBC 0.0 0.0 - 0.2 %  Type and screen [place]     Status: None   Collection Time: 09/11/20 10:10 PM  Result Value Ref Range   ABO/RH(D) A POS    Antibody Screen NEG    Sample Expiration      09/14/2020,2359 Performed at [facility], [place] [address], [place], [place] [address]   RPR     Status: None   Collection Time: 09/11/20 10:10 PM  Result Value Ref Range   RPR Ser Ql NON REACTIVE NON REACTIVE  CBC     Status: Abnormal   Collection Time: 09/11/20 11:47 PM  Result Value Ref Range   WBC 13.7 (H) 4.0 - 10.5 K/uL    RBC 4.08 3.87 - 5.11 MIL/uL   Hemoglobin 10.9 (L) 12.0 - 15.0 g/dL   HCT 34.1 (L) 36 - 46 %   MCV 83.6 80.0 - 100.0 fL   MCH 26.7 26.0 - 34.0 pg   MCHC 32.0 30.0 - 36.0 g/dL   RDW 14.4 11.5 - 15.5 %   Platelets 204 150 - 400 K/uL   nRBC 0.0 0.0 - 0.2 %    Assessment / Plan: [redacted]w[redacted]d week IUP. ROM x 14 hours. No evidence of chorio. S/P BMZ x 1. Labor: Early Fetal Wellbeing:  Category II, but overall reassuring.  Pain Control:  Comfort measures Anticipated MOD:  SVD. Anticipate delivery today.  NICU consult done.  Tamala Julian, [place], [place] 09/12/2020 11:45 AM

## 2020-09-12 NOTE — Progress Notes (Signed)
Labor Progress Note MEILANI EDMUNDSON is a 34 y.o. G2P0010 at [redacted]w[redacted]d presented for PPROM.   S: Pt awake and resting comfortably in bed. Isn't feeling contractions anymore. No complaints.   O:  BP (!) 94/46   Pulse 89   Temp 98.7 F (37.1 C) (Oral)   Resp 16   Ht 5\' 4"  (1.626 m)   Wt 79.4 kg   LMP 01/28/2020 (Exact Date)   SpO2 100%   BMI 30.04 kg/m  EFM: baseline 150 bpm / moderate variability / 15x15 accels, multiple variable decels  CVE: Dilation: 2.5 Effacement (%): 50 Station: -3 Exam by:: J.Bellamy, RN   A&P: 34 y.o. G2P0010 [redacted]w[redacted]d presented for PPROM #Pain: prn #FWB: Cat 2 for variable decels #GBS pending d/t GA, but already on PPROM abx that cover GBS --------- #Obesity in pregnancy #Ovarian cyst #Uterine fibroids #Enlarged thyroid during pregnancy with normal thyroid studies  Ezequiel Essex, MD 5:12 AM

## 2020-09-12 NOTE — Anesthesia Preprocedure Evaluation (Signed)
Anesthesia Evaluation  Patient identified by MRN, date of birth, ID band Patient awake    Reviewed: Allergy & Precautions, NPO status , Patient's Chart, lab work & pertinent test results  Airway Mallampati: II  TM Distance: >3 FB Neck ROM: Full    Dental no notable dental hx.    Pulmonary neg pulmonary ROS,    Pulmonary exam normal breath sounds clear to auscultation       Cardiovascular negative cardio ROS Normal cardiovascular exam Rhythm:Regular Rate:Normal     Neuro/Psych negative neurological ROS  negative psych ROS   GI/Hepatic negative GI ROS, Neg liver ROS,   Endo/Other  negative endocrine ROS  Renal/GU negative Renal ROS  negative genitourinary   Musculoskeletal negative musculoskeletal ROS (+)   Abdominal   Peds  Hematology  (+) Blood dyscrasia (Hgb 10.9), anemia ,   Anesthesia Other Findings PPROM at 32/[redacted] weeks gestation  Reproductive/Obstetrics (+) Pregnancy                             Anesthesia Physical Anesthesia Plan  ASA: II  Anesthesia Plan: Epidural   Post-op Pain Management:    Induction:   PONV Risk Score and Plan: Treatment may vary due to age or medical condition  Airway Management Planned: Natural Airway  Additional Equipment:   Intra-op Plan:   Post-operative Plan:   Informed Consent: I have reviewed the patients History and Physical, chart, labs and discussed the procedure including the risks, benefits and alternatives for the proposed anesthesia with the patient or authorized representative who has indicated his/her understanding and acceptance.       Plan Discussed with: Anesthesiologist  Anesthesia Plan Comments: (Patient identified. Risks, benefits, options discussed with patient including but not limited to bleeding, infection, nerve damage, paralysis, failed block, incomplete pain control, headache, blood pressure changes, nausea,  vomiting, reactions to medication, itching, and post partum back pain. Confirmed with bedside nurse the patient's most recent platelet count. Confirmed with the patient that they are not taking any anticoagulation, have any bleeding history or any family history of bleeding disorders. Patient expressed understanding and wishes to proceed. All questions were answered. )        Anesthesia Quick Evaluation

## 2020-09-12 NOTE — Consult Note (Signed)
[place] 09/12/2020    10:17 PM  Neonatal Medicine Consultation         Ana Moore          MRN:  [id number]  I was called at the request of the patient's obstetrician [facility] staff for Dr. Harolyn Rutherford) to speak to this patient due to probable delivery at 69 completed weeks gestation.  The patient's prenatal course includes PROM today.  She is 32 4/7 weeks currently.  She is admitted to L&D.  Her membranes ruptured on 10/9 at 19:08.  She was admitted to [facility] and given betamethasone, latency antibiotics. I met with her and her partner, and reviewed expectations for a baby born at 22 weeks, including survival, length of stay, morbidities such as respiratory distress, infection, feeding.  I described how we provide respiratory and feeding support.  Mom plans to breast feed, which I encouraged as best for the baby (with supplementations for needed calories).    I spent 30 minutes reviewing the record, speaking to the patient, and entering appropriate documentation.  More than 50% of the time was spent face to face with patient.   _____________________ Roosevelt Locks, MD Attending Neonatologist

## 2020-09-13 MED ORDER — WITCH HAZEL-GLYCERIN EX PADS: 1.0000 "application " | MEDICATED_PAD | CUTANEOUS | Status: DC | PRN

## 2020-09-13 MED ORDER — COCONUT OIL OIL
1.0000 "application " | TOPICAL_OIL | Status: DC | PRN
  Administered 2020-09-13: 1 via TOPICAL

## 2020-09-13 MED ORDER — IBUPROFEN 600 MG PO TABS
600.0000 mg | ORAL_TABLET | Freq: Four times a day (QID) | ORAL | Status: DC
  Administered 2020-09-13 – 2020-09-14 (×4): 600 mg via ORAL
  Filled 2020-09-13 (×5): qty 1

## 2020-09-13 MED ORDER — DIBUCAINE (PERIANAL) 1 % EX OINT: 1.0000 "application " | TOPICAL_OINTMENT | CUTANEOUS | Status: DC | PRN

## 2020-09-13 MED ORDER — SIMETHICONE 80 MG PO CHEW: 80.0000 mg | CHEWABLE_TABLET | ORAL | Status: DC | PRN

## 2020-09-13 MED ORDER — SENNOSIDES-DOCUSATE SODIUM 8.6-50 MG PO TABS
2.0000 | ORAL_TABLET | ORAL | Status: DC
  Administered 2020-09-14: 2 via ORAL
  Filled 2020-09-13: qty 2

## 2020-09-13 MED ORDER — PRENATAL MULTIVITAMIN CH
1.0000 | ORAL_TABLET | Freq: Every day | ORAL | Status: DC
  Administered 2020-09-13: 1 via ORAL
  Filled 2020-09-13: qty 1

## 2020-09-13 MED ORDER — ACETAMINOPHEN 325 MG PO TABS
650.0000 mg | ORAL_TABLET | Freq: Four times a day (QID) | ORAL | Status: DC
  Administered 2020-09-13 – 2020-09-14 (×4): 650 mg via ORAL
  Filled 2020-09-13 (×4): qty 2

## 2020-09-13 MED ORDER — ONDANSETRON HCL 4 MG PO TABS: 4.0000 mg | ORAL_TABLET | ORAL | Status: DC | PRN

## 2020-09-13 MED ORDER — TETANUS-DIPHTH-ACELL PERTUSSIS 5-2.5-18.5 LF-MCG/0.5 IM SUSP: 0.5000 mL | Freq: Once | INTRAMUSCULAR | Status: DC

## 2020-09-13 MED ORDER — DIPHENHYDRAMINE HCL 25 MG PO CAPS: 25.0000 mg | ORAL_CAPSULE | Freq: Four times a day (QID) | ORAL | Status: DC | PRN

## 2020-09-13 MED ORDER — BENZOCAINE-MENTHOL 20-0.5 % EX AERO
1.0000 "application " | INHALATION_SPRAY | CUTANEOUS | Status: DC | PRN
  Administered 2020-09-13: 1 via TOPICAL
  Filled 2020-09-13: qty 56

## 2020-09-13 MED ORDER — ONDANSETRON HCL 4 MG/2ML IJ SOLN: 4.0000 mg | INTRAMUSCULAR | Status: DC | PRN

## 2020-09-13 NOTE — Plan of Care (Signed)
  Problem: Skin Integrity: Goal: Risk for impaired skin integrity will decrease 09/13/2020 0113 by Lora Havens, RN Reactivated 09/13/2020 0113 by Lora Havens, RN Outcome: Completed/Met   Problem: Education: Goal: Knowledge of Childbirth will improve Outcome: Completed/Met Goal: Ability to make informed decisions regarding treatment and plan of care will improve Outcome: Completed/Met Goal: Ability to state and carry out methods to decrease the pain will improve Outcome: Completed/Met Goal: Individualized Educational Video(s) Outcome: Completed/Met   Problem: Coping: Goal: Ability to verbalize concerns and feelings about labor and delivery will improve Outcome: Completed/Met   Problem: Life Cycle: Goal: Ability to make normal progression through stages of labor will improve Outcome: Completed/Met Goal: Ability to effectively push during vaginal delivery will improve Outcome: Completed/Met   Problem: Role Relationship: Goal: Will demonstrate positive interactions with the child Outcome: Completed/Met   Problem: Safety: Goal: Risk of complications during labor and delivery will decrease Outcome: Completed/Met   Problem: Pain Management: Goal: Relief or control of pain from uterine contractions will improve Outcome: Completed/Met   09/13/2020 0113 by Lora Havens, RN Outcome: Completed/Met

## 2020-09-13 NOTE — Procedures (Signed)
  Post-Placental IUD Insertion Procedure Note  Patient identified, informed consent signed prior to delivery, signed copy in chart, time out was performed.    Vaginal, labial and perineal areas thoroughly inspected for lacerations. Second degree laceration identified - not hemostatic, not repaired prior to insertion of IUD.   - Liletta IUD grasped between sterile gloved fingers. Sterile lubrication applied to sterile gloved hand for ease of insertion. Fundus identified through abdominal wall using non-insertion hand. IUD inserted to fundus with bimanual technique. IUD carefully released at the fundus and insertion hand gently removed from vagina.   Strings trimmed to the level of the introitus. Patient tolerated procedure well.  Patient given post procedure instructions and IUD care card with expiration date.  Patient is asked to keep IUD strings tucked in her vagina until her postpartum follow up visit in 4-6 weeks. Patient advised to abstain from sexual intercourse and pulling on strings before her follow-up visit. Patient verbalized an understanding of the plan of care and agrees.   Randa Ngo, MD OB Fellow, Faculty Practice 09/13/2020 7:47 PM

## 2020-09-13 NOTE — Progress Notes (Signed)
Patient screened out for psychosocial assessment since none of the following apply:  Psychosocial stressors documented in mother or baby's chart  Gestation less than 32 weeks  Code at delivery   Infant with anomalies Please contact the Clinical Social Worker if specific needs arise, by MOB's request, or if MOB scores greater than 9/yes to question 10 on Edinburgh Postpartum Depression Screen.  Krissia Schreier, LCSW Clinical Social Worker [facility] Cell#: [phone]     

## 2020-09-13 NOTE — Anesthesia Postprocedure Evaluation (Signed)
Anesthesia Post Note  Patient: Ana Moore  Procedure(s) Performed: AN AD HOC LABOR EPIDURAL     Patient location during evaluation: Mother Baby Anesthesia Type: Epidural Level of consciousness: awake and alert Pain management: pain level controlled Vital Signs Assessment: post-procedure vital signs reviewed and stable Respiratory status: spontaneous breathing, nonlabored ventilation and respiratory function stable Cardiovascular status: stable Postop Assessment: no headache, no backache and epidural receding Anesthetic complications: no   No complications documented.  Last Vitals:  Vitals:   09/13/20 0605 09/13/20 0719  BP: 113/70 102/71  Pulse: 88 66  Resp: 16 17  Temp: 36.6 C 36.8 C  SpO2: 99% 100%    Last Pain:  Vitals:   09/13/20 0719  TempSrc: Oral  PainSc:    Pain Goal: Patients Stated Pain Goal: 0 (09/12/20 1145)                 Wonda Goodgame

## 2020-09-13 NOTE — Progress Notes (Signed)
Post Partum Day 1 s/p preterm vaginal delivery after PTL at [redacted]w[redacted]d  Subjective: No complaints, up ad lib, voiding, tolerating PO and + flatus  Objective: Blood pressure 102/71, pulse 66, temperature 98.3 F (36.8 C), temperature source Oral, resp. rate 17, height 5\' 4"  (1.626 m), weight 79.4 kg, last menstrual period 01/28/2020, SpO2 100 %, unknown if currently breastfeeding.  Physical Exam:  General: alert and no distress Lochia: appropriate Uterine Fundus: firm, NT Incision: N/A DVT Evaluation: No evidence of DVT seen on physical exam. Negative Homan's sign. No cords or calf tenderness.  Recent Labs    09/11/20 2210 09/11/20 2347  HGB 10.7* 10.9*  HCT 33.2* 34.1*    Assessment/Plan: Plan for discharge tomorrow, Breastfeeding and Contraception PPIUD done   LOS: 2 days   Verita Schneiders, MD 09/13/2020, 8:41 AM

## 2020-09-14 LAB — CULTURE, BETA STREP (GROUP B ONLY)

## 2020-09-14 MED ORDER — INFLUENZA VAC SPLIT QUAD 0.5 ML IM SUSY
0.5000 mL | PREFILLED_SYRINGE | Freq: Once | INTRAMUSCULAR | Status: DC
  Filled 2020-09-14: qty 0.5

## 2020-09-14 MED ORDER — IBUPROFEN 600 MG PO TABS
600.0000 mg | ORAL_TABLET | Freq: Four times a day (QID) | ORAL | 0 refills | Status: DC
Start: 2020-09-14 — End: 2023-03-03

## 2020-09-14 NOTE — Discharge Instructions (Signed)

## 2020-09-15 ENCOUNTER — Ambulatory Visit: Payer: Self-pay

## 2020-09-15 NOTE — Lactation Note (Signed)
This note was copied from a baby's chart. Lactation Consultation Note  Patient Name: Boy Shrita Thien ITJLL'V Date: 09/15/2020 Reason for consult: Follow-up assessment  P1 mother whose infant is now 65 hours old.  This is a preterm baby at 32+4 weeks with a CGA of 33+0 weeks and in the NICU.  RN requested [place] assistance.  Mother was holding baby when I arrived.  She had a few questions regarding pumping.  Mother has an appointment to pick up her [facility] pump today.  She has only pumped one time since delivery.  Reviewed pump parts, set up, milk storage and transport.  Explained how to assess the flange for correct size and fit.  Reviewed the pump phases and suction pressure.  Encouraged mother to call her RN/LC for pump assistance as needed when visiting her baby.  Mother verbalized understanding.   Maternal Data    Feeding Feeding Type: Donor Breast Milk  LATCH Score                   Interventions    Lactation Tools Discussed/Used     Consult Status Consult Status: PRN Date: 09/15/20 Follow-up type: Call as needed    Adasia Hoar R Ortha Metts 09/15/2020, 2:42 PM

## 2020-09-17 ENCOUNTER — Encounter: Payer: Medicaid Other | Admitting: Medical

## 2020-09-21 ENCOUNTER — Ambulatory Visit: Payer: Self-pay

## 2020-09-21 NOTE — Lactation Note (Signed)
This note was copied from a baby's chart. Lactation Consultation Note  Patient Name: Ana Moore Date: 09/21/2020 Reason for consult: Follow-up assessment;1st time breastfeeding;NICU baby;Infant < 6lbs;Preterm <34wks  LC in to visit with P68 Mom of 26 day old baby in the NICU.  Mom has decided to stop pumping and exclusively formula feed.    Mom knows to call prn for concerns, but denies any questions currently.   Consult Status Consult Status: Complete Date: 09/21/20 Follow-up type: Call as needed    Broadus John 09/21/2020, 4:10 PM

## 2020-10-05 ENCOUNTER — Ambulatory Visit: Payer: Medicaid Other | Attending: Medical

## 2020-10-05 ENCOUNTER — Ambulatory Visit: Payer: Medicaid Other

## 2020-10-20 ENCOUNTER — Other Ambulatory Visit: Payer: Self-pay

## 2020-10-20 ENCOUNTER — Encounter: Payer: Self-pay | Admitting: Student

## 2020-10-20 ENCOUNTER — Ambulatory Visit (INDEPENDENT_AMBULATORY_CARE_PROVIDER_SITE_OTHER): Payer: Medicaid Other | Admitting: Student

## 2020-10-20 VITALS — BP 128/81 | HR 82 | Ht 64.0 in | Wt 169.5 lb

## 2020-10-20 DIAGNOSIS — Z8759 Personal history of other complications of pregnancy, childbirth and the puerperium: Secondary | ICD-10-CM

## 2020-10-20 DIAGNOSIS — Z975 Presence of (intrauterine) contraceptive device: Secondary | ICD-10-CM

## 2020-10-20 DIAGNOSIS — R87610 Atypical squamous cells of undetermined significance on cytologic smear of cervix (ASC-US): Secondary | ICD-10-CM

## 2020-10-20 DIAGNOSIS — E049 Nontoxic goiter, unspecified: Secondary | ICD-10-CM

## 2020-10-20 NOTE — Progress Notes (Signed)
    [place] Partum Visit Note  Ana Moore is a 34 y.o. [phone] female who presents for a postpartum visit. She is 5 weeks postpartum following a normal spontaneous vaginal delivery.  I have fully reviewed the prenatal and intrapartum course. The delivery was at 32/4 gestational weeks. She had an NSVD after PPROM.  Anesthesia: epidural. Postpartum course has been uneventful Baby is doing well. Baby is feeding by bottle - [place] Good Start. Bleeding staining only. Bowel function is normal. Bladder function is normal. Patient is not sexually active. Contraception method is IUD. Postpartum depression screening: negative.   The pregnancy intention screening data noted above was reviewed. Potential methods of contraception were discussed. The patient elected to proceed with IUD or IUS.   The following portions of the patient's history were reviewed and updated as appropriate: allergies, current medications, past family history, past medical history, past social history, past surgical history and problem list.  Review of Systems Pertinent items are noted in HPI.    Objective:  LMP 01/28/2020 (Exact Date)    General:  alert, cooperative and no distress   Breasts:  negative  Lungs: clear to auscultation bilaterally  Heart:  regular rate and rhythm, S1, S2 normal, no murmur, click, rub or gallop  Abdomen: soft, non-tender; bowel sounds normal; no masses,  no organomegaly   Vulva:  normal  Vagina: positive for lochia, IUD strings visualized, 2nd degree repair still healing, tender to touch  Cervix:  no lesions  Corpus: not examined  Adnexa:  not evaluated  Rectal Exam: Not performed.        Assessment:    Healthy postpartum exam. Pap smear not done at today's visit.   Plan:   Essential components of care per ACOG recommendations:  1.  Mood and well being: Patient with negative depression screening today. Reviewed local resources for support.  - Patient does not use tobacco. - hx of  drug use? No   2. Infant care and feeding:  -Patient currently breastmilk feeding? No  -Social determinants of health (SDOH) reviewed in EPIC. No concerns  3. Sexuality, contraception and birth spacing - Patient does not want a pregnancy in the next year.  Desired family size is 1  children.  - Reviewed forms of contraception in tiered fashion. Patient desired IUD today.   - Discussed birth spacing of 18 months  4. Sleep and fatigue -Encouraged family/partner/community support of 4 hrs of uninterrupted sleep to help with mood and fatigue  5. Physical Recovery  - Discussed patients delivery and complications - Patient had a 2nd degree laceration, perineal healing reviewed. Patient expressed understanding - Patient has urinary incontinence? No  - Patient is not safe to resume physical and sexual activity. Wait until 4 week check up and will examine laceration again.   6.  Health Maintenance - Last pap smear done and was abnormal with ASCUS with negative HPV. Patient will schedule follow up pap in May.  Mammogram  7. NA Chronic Disease - will check TSH labs today  Bethanne Ginger, [place] for [employer], [place]

## 2020-10-21 LAB — TSH: TSH: 0.616 u[IU]/mL (ref 0.450–4.500)

## 2020-10-21 LAB — T4, FREE: Free T4: 1.07 ng/dL (ref 0.82–1.77)

## 2020-11-17 ENCOUNTER — Ambulatory Visit: Payer: Medicaid Other | Admitting: Student

## 2020-11-28 ENCOUNTER — Ambulatory Visit (HOSPITAL_COMMUNITY)
Admission: EM | Admit: 2020-11-28 | Discharge: 2020-11-28 | Disposition: A | Discharge status: Home / Self Care | Payer: Medicaid Other | Attending: Urgent Care | Admitting: Urgent Care

## 2020-11-28 ENCOUNTER — Encounter (HOSPITAL_COMMUNITY): Payer: Self-pay

## 2020-11-28 ENCOUNTER — Other Ambulatory Visit: Payer: Self-pay

## 2020-11-28 DIAGNOSIS — R519 Headache, unspecified: Secondary | ICD-10-CM

## 2020-11-28 DIAGNOSIS — R030 Elevated blood-pressure reading, without diagnosis of hypertension: Secondary | ICD-10-CM

## 2020-11-28 MED ORDER — NAPROXEN 500 MG PO TABS: 500.0000 mg | ORAL_TABLET | Freq: Two times a day (BID) | ORAL | 0 refills | Status: DC

## 2020-11-28 NOTE — ED Triage Notes (Signed)
Pt present elevated blood pressure with headache. Pt states symptoms started a week ago. Pt tried OTC medication with no relief.
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
## 2020-11-28 NOTE — Discharge Instructions (Signed)

## 2021-09-07 ENCOUNTER — Encounter (HOSPITAL_COMMUNITY): Payer: Self-pay

## 2021-09-07 ENCOUNTER — Other Ambulatory Visit: Payer: Self-pay

## 2021-09-07 ENCOUNTER — Emergency Department (HOSPITAL_COMMUNITY)
Admission: EM | Admit: 2021-09-07 | Discharge: 2021-09-07 | Disposition: A | Discharge status: Home / Self Care | Payer: Medicaid Other | Attending: Emergency Medicine | Admitting: Emergency Medicine

## 2021-09-07 ENCOUNTER — Emergency Department (HOSPITAL_COMMUNITY): Payer: Medicaid Other

## 2021-09-07 DIAGNOSIS — R519 Headache, unspecified: Secondary | ICD-10-CM | POA: Insufficient documentation

## 2021-09-07 DIAGNOSIS — M79645 Pain in left finger(s): Secondary | ICD-10-CM | POA: Insufficient documentation

## 2021-09-07 DIAGNOSIS — S0592XA Unspecified injury of left eye and orbit, initial encounter: Secondary | ICD-10-CM | POA: Diagnosis present

## 2021-09-07 DIAGNOSIS — S0502XA Injury of conjunctiva and corneal abrasion without foreign body, left eye, initial encounter: Secondary | ICD-10-CM | POA: Insufficient documentation

## 2021-09-07 MED ORDER — TETRACAINE HCL 0.5 % OP SOLN
2.0000 [drp] | Freq: Once | OPHTHALMIC | Status: AC
  Administered 2021-09-07: 2 [drp] via OPHTHALMIC
  Filled 2021-09-07: qty 4

## 2021-09-07 MED ORDER — FLUORESCEIN SODIUM 1 MG OP STRP
1.0000 | ORAL_STRIP | Freq: Once | OPHTHALMIC | Status: AC
  Administered 2021-09-07: 1 via OPHTHALMIC
  Filled 2021-09-07: qty 1

## 2021-09-07 MED ORDER — ACETAMINOPHEN 500 MG PO TABS
1000.0000 mg | ORAL_TABLET | Freq: Once | ORAL | Status: AC
  Administered 2021-09-07: 1000 mg via ORAL
  Filled 2021-09-07: qty 2

## 2021-09-07 MED ORDER — CIPROFLOXACIN HCL 0.3 % OP OINT: TOPICAL_OINTMENT | OPHTHALMIC | 0 refills | Status: DC
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
## 2021-09-07 NOTE — Discharge Instructions (Signed)
You were evaluated in the Emergency Department and after careful evaluation, we did not find any emergent condition requiring admission or further testing in the hospital.  Your exam/testing today is overall reassuring.  CT scans did not show any emergencies.  You have a corneal abrasion/ulcer on the surface of your left eye.  Please take the antibiotic ointment as directed and follow-up closely with an eye specialist.  Please return to the Emergency Department if you experience any worsening of your condition.   Thank you for allowing [place] to be a part of your care.
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
## 2023-03-03 ENCOUNTER — Emergency Department (HOSPITAL_COMMUNITY)
Admission: EM | Admit: 2023-03-03 | Discharge: 2023-03-03 | Disposition: A | Discharge status: Home / Self Care | Payer: Medicaid Other | Attending: Emergency Medicine | Admitting: Emergency Medicine

## 2023-03-03 ENCOUNTER — Encounter (HOSPITAL_COMMUNITY): Payer: Self-pay

## 2023-03-03 DIAGNOSIS — T40715A Adverse effect of cannabis, initial encounter: Secondary | ICD-10-CM | POA: Insufficient documentation

## 2023-03-03 DIAGNOSIS — Y906 Blood alcohol level of 120-199 mg/100 ml: Secondary | ICD-10-CM | POA: Insufficient documentation

## 2023-03-03 DIAGNOSIS — F1290 Cannabis use, unspecified, uncomplicated: Secondary | ICD-10-CM | POA: Insufficient documentation

## 2023-03-03 DIAGNOSIS — F10929 Alcohol use, unspecified with intoxication, unspecified: Secondary | ICD-10-CM | POA: Diagnosis not present

## 2023-03-03 DIAGNOSIS — F10120 Alcohol abuse with intoxication, uncomplicated: Secondary | ICD-10-CM | POA: Insufficient documentation

## 2023-03-03 DIAGNOSIS — F10920 Alcohol use, unspecified with intoxication, uncomplicated: Secondary | ICD-10-CM

## 2023-03-03 LAB — CBC WITH DIFFERENTIAL/PLATELET
Abs Immature Granulocytes: 0.04 10*3/uL (ref 0.00–0.07)
Basophils Absolute: 0 10*3/uL (ref 0.0–0.1)
Basophils Relative: 1 %
Eosinophils Absolute: 0.1 10*3/uL (ref 0.0–0.5)
Eosinophils Relative: 1 %
HCT: 42.1 % (ref 36.0–46.0)
Hemoglobin: 13.5 g/dL (ref 12.0–15.0)
Immature Granulocytes: 1 %
Lymphocytes Relative: 23 %
Lymphs Abs: 2 10*3/uL (ref 0.7–4.0)
MCH: 27.4 pg (ref 26.0–34.0)
MCHC: 32.1 g/dL (ref 30.0–36.0)
MCV: 85.6 fL (ref 80.0–100.0)
Monocytes Absolute: 0.6 10*3/uL (ref 0.1–1.0)
Monocytes Relative: 7 %
Neutro Abs: 6 10*3/uL (ref 1.7–7.7)
Neutrophils Relative %: 67 %
Platelets: 343 10*3/uL (ref 150–400)
RBC: 4.92 MIL/uL (ref 3.87–5.11)
RDW: 13.7 % (ref 11.5–15.5)
WBC: 8.8 10*3/uL (ref 4.0–10.5)
nRBC: 0 % (ref 0.0–0.2)

## 2023-03-03 LAB — COMPREHENSIVE METABOLIC PANEL
ALT: 16 U/L (ref 0–44)
AST: 18 U/L (ref 15–41)
Albumin: 4.1 g/dL (ref 3.5–5.0)
Alkaline Phosphatase: 75 U/L (ref 38–126)
Anion gap: 13 (ref 5–15)
BUN: 14 mg/dL (ref 6–20)
CO2: 18 mmol/L — ABNORMAL LOW (ref 22–32)
Calcium: 8.7 mg/dL — ABNORMAL LOW (ref 8.9–10.3)
Chloride: 107 mmol/L (ref 98–111)
Creatinine, Ser: 0.69 mg/dL (ref 0.44–1.00)
GFR, Estimated: >60 mL/min (ref >60)
Glucose, Bld: 148 mg/dL — ABNORMAL HIGH (ref 70–99)
Potassium: 3.6 mmol/L (ref 3.5–5.1)
Sodium: 138 mmol/L (ref 135–145)
Total Bilirubin: 0.3 mg/dL (ref 0.3–1.2)
Total Protein: 7.8 g/dL (ref 6.5–8.1)

## 2023-03-03 LAB — RAPID URINE DRUG SCREEN, HOSP PERFORMED
Amphetamines: NOT DETECTED
Barbiturates: NOT DETECTED
Benzodiazepines: NOT DETECTED
Cocaine: NOT DETECTED
Opiates: NOT DETECTED
Tetrahydrocannabinol: POSITIVE — AB

## 2023-03-03 LAB — ETHANOL: Alcohol, Ethyl (B): 128 mg/dL — ABNORMAL HIGH (ref <10)
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
